# Patient Record
Sex: Female | Born: 1945 | Race: Black or African American | Hispanic: No | Marital: Married | Smoking: Former smoker
Health system: Southern US, Community
[De-identification: ages and names within clinical notes are randomized; demographics above are authoritative.]

## PROBLEM LIST (undated history)

## (undated) DIAGNOSIS — M439 Deforming dorsopathy, unspecified: Secondary | ICD-10-CM

## (undated) DIAGNOSIS — M1990 Unspecified osteoarthritis, unspecified site: Secondary | ICD-10-CM

## (undated) DIAGNOSIS — E039 Hypothyroidism, unspecified: Secondary | ICD-10-CM

## (undated) DIAGNOSIS — M4800 Spinal stenosis, site unspecified: Secondary | ICD-10-CM

## (undated) DIAGNOSIS — E785 Hyperlipidemia, unspecified: Secondary | ICD-10-CM

## (undated) DIAGNOSIS — I10 Essential (primary) hypertension: Secondary | ICD-10-CM

## (undated) DIAGNOSIS — M109 Gout, unspecified: Secondary | ICD-10-CM

## (undated) HISTORY — DX: Deforming dorsopathy, unspecified: M43.9

## (undated) HISTORY — DX: Gout, unspecified: M10.9

## (undated) HISTORY — DX: Spinal stenosis, site unspecified: M48.00

## (undated) HISTORY — PX: THYROID SURGERY: SHX805

## (undated) HISTORY — PX: HERNIA REPAIR: SHX51

---

## 2005-07-03 ENCOUNTER — Other Ambulatory Visit: Admission: RE | Admit: 2005-07-03 | Discharge: 2005-07-03 | Payer: Self-pay | Admitting: Family Medicine

## 2005-07-10 ENCOUNTER — Encounter: Admission: RE | Admit: 2005-07-10 | Discharge: 2005-07-10 | Payer: Self-pay | Admitting: Obstetrics and Gynecology

## 2005-08-21 ENCOUNTER — Encounter (INDEPENDENT_AMBULATORY_CARE_PROVIDER_SITE_OTHER): Payer: Self-pay | Admitting: *Deleted

## 2005-08-21 ENCOUNTER — Ambulatory Visit (HOSPITAL_COMMUNITY): Admission: RE | Admit: 2005-08-21 | Discharge: 2005-08-22 | Payer: Self-pay

## 2006-08-12 ENCOUNTER — Encounter: Admission: RE | Admit: 2006-08-12 | Discharge: 2006-08-12 | Payer: Self-pay | Admitting: Family Medicine

## 2007-10-09 ENCOUNTER — Encounter: Admission: RE | Admit: 2007-10-09 | Discharge: 2007-10-09 | Payer: Self-pay | Admitting: Obstetrics and Gynecology

## 2008-11-03 ENCOUNTER — Other Ambulatory Visit: Admission: RE | Admit: 2008-11-03 | Discharge: 2008-11-03 | Payer: Self-pay | Admitting: Family Medicine

## 2008-12-09 ENCOUNTER — Encounter: Admission: RE | Admit: 2008-12-09 | Discharge: 2008-12-09 | Payer: Self-pay | Admitting: Family Medicine

## 2010-08-02 ENCOUNTER — Other Ambulatory Visit (HOSPITAL_COMMUNITY): Payer: Self-pay | Admitting: Family Medicine

## 2010-08-02 DIAGNOSIS — Z1231 Encounter for screening mammogram for malignant neoplasm of breast: Secondary | ICD-10-CM

## 2010-08-11 ENCOUNTER — Ambulatory Visit (HOSPITAL_COMMUNITY)
Admission: RE | Admit: 2010-08-11 | Discharge: 2010-08-11 | Disposition: A | Discharge status: Home / Self Care | Payer: Self-pay | Source: Ambulatory Visit | Attending: Family Medicine | Admitting: Family Medicine

## 2010-08-11 DIAGNOSIS — Z1231 Encounter for screening mammogram for malignant neoplasm of breast: Secondary | ICD-10-CM

## 2010-09-15 NOTE — Op Note (Signed)
[name], [name] NO.:  [id number]   MEDICAL RECORD NO.:  [id number]          PATIENT TYPE:  OIB   LOCATION:  5703                         FACILITY:  [facility]   PHYSICIAN:  [name], [name]   DATE OF BIRTH:  January 01, 1946   DATE OF PROCEDURE:  08/30/2005  DATE OF DISCHARGE:  08/22/2005                                 OPERATIVE REPORT   PREOPERATIVE DIAGNOSIS:  Left thyroid nodule.   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   OPERATION:  Total thyroidectomy.   SURGEON:  [name], [name]   ASSISTANT:  [name], [name]   ANESTHESIA:  General.   PROCEDURE:  After the patient was monitored and asleep and had routine  preparation and draping of the neck, I made a collar type incision a couple  of cm above the clavicles, centered in the midline, and approximately 6 cm  in length.  I deepened the dissection through the platysma and developed  flaps superiorly to the thyroid cartilage and inferiorly to the sternal  notch.  I incised the straps in the midline and exposed thyroid gland.  The  right lobe felt normal.  The left lobe had a large nodule.  The gland,  itself, was somewhat shrunken.  ?I dissected laterally on the left dividing  veins toward the middle of the thyroid gland with clips.  I then dissected  out the superior thyroid vessels and ligated them with 2-0 silk and also  clipped and then divided them.  Rolling the gland forward, I pulled it up  and divided the vessels and inferiorly staying close to the gland, taking  care to avoid injury of the parathyroids and visualizing and avoiding the  recurrent laryngeal nerve.  I dissected over to the isthmus and found that  there was a little bit of a pyramidal lobe and I excised that along with the  left lobe.  I then suture ligated the isthmus and removed the left lobe.  I  sent it for frozen section evaluation and [name] reported that it was a  nodule which was highly  suspicious for papillary thyroid cancer.  Dr.  Zachery Dakins and I felt that we should then remove the other lobe, as well.  I  exposed the left lobe by dissecting under the straps and followed a similar  method of dissection and removal, again visualizing the recurrent laryngeal  nerve and staying close to the gland so as to avoid injury to the  parathyroids.  I did see a couple of parathyroid glands and felt that I had  not removed an excess of parathyroid tissue.  After removal of the right  lobe, I irrigated both sides, got hemostasis, and packed the dissected areas  with some Surgicel.  I closed the midline and the platysma with 3-0 Vicryl  and closed the skin with intracuticular 4-0 Vicryl and Steri-Strips.  The  patient was stable throughout.      [name], [name]  Electronically Signed     WB/MEDQ  D:  08/30/2005  T:  08/30/2005  Job:  [id number]

## 2011-07-17 ENCOUNTER — Other Ambulatory Visit (HOSPITAL_COMMUNITY): Payer: Self-pay | Admitting: Family Medicine

## 2011-07-17 DIAGNOSIS — Z1231 Encounter for screening mammogram for malignant neoplasm of breast: Secondary | ICD-10-CM

## 2011-08-17 ENCOUNTER — Ambulatory Visit (HOSPITAL_COMMUNITY)
Admission: RE | Admit: 2011-08-17 | Discharge: 2011-08-17 | Disposition: A | Discharge status: Home / Self Care | Payer: Self-pay | Source: Ambulatory Visit | Attending: Family Medicine | Admitting: Family Medicine

## 2011-08-17 DIAGNOSIS — Z1231 Encounter for screening mammogram for malignant neoplasm of breast: Secondary | ICD-10-CM

## 2012-07-21 ENCOUNTER — Other Ambulatory Visit: Payer: Self-pay

## 2012-07-21 DIAGNOSIS — Z1231 Encounter for screening mammogram for malignant neoplasm of breast: Secondary | ICD-10-CM

## 2012-08-25 ENCOUNTER — Ambulatory Visit
Admission: RE | Admit: 2012-08-25 | Discharge: 2012-08-25 | Disposition: A | Discharge status: Home / Self Care | Payer: Medicare Other | Source: Ambulatory Visit

## 2012-08-25 DIAGNOSIS — Z1231 Encounter for screening mammogram for malignant neoplasm of breast: Secondary | ICD-10-CM

## 2013-07-30 ENCOUNTER — Other Ambulatory Visit: Payer: Self-pay | Admitting: Family Medicine

## 2013-07-30 ENCOUNTER — Ambulatory Visit
Admission: RE | Admit: 2013-07-30 | Discharge: 2013-07-30 | Disposition: A | Discharge status: Home / Self Care | Payer: Commercial Managed Care - HMO | Source: Ambulatory Visit | Attending: Family Medicine | Admitting: Family Medicine

## 2013-07-30 DIAGNOSIS — M545 Low back pain, unspecified: Secondary | ICD-10-CM

## 2013-07-30 DIAGNOSIS — M79605 Pain in left leg: Secondary | ICD-10-CM

## 2013-08-26 ENCOUNTER — Other Ambulatory Visit: Payer: Self-pay

## 2013-08-26 DIAGNOSIS — Z1231 Encounter for screening mammogram for malignant neoplasm of breast: Secondary | ICD-10-CM

## 2013-09-03 ENCOUNTER — Ambulatory Visit
Admission: RE | Admit: 2013-09-03 | Discharge: 2013-09-03 | Disposition: A | Discharge status: Home / Self Care | Payer: Commercial Managed Care - HMO | Source: Ambulatory Visit

## 2013-09-03 ENCOUNTER — Encounter (INDEPENDENT_AMBULATORY_CARE_PROVIDER_SITE_OTHER): Payer: Self-pay

## 2013-09-03 DIAGNOSIS — Z1231 Encounter for screening mammogram for malignant neoplasm of breast: Secondary | ICD-10-CM

## 2014-03-10 ENCOUNTER — Other Ambulatory Visit: Payer: Self-pay | Admitting: Gastroenterology

## 2014-06-07 DIAGNOSIS — E039 Hypothyroidism, unspecified: Secondary | ICD-10-CM | POA: Diagnosis not present

## 2014-06-17 DIAGNOSIS — E039 Hypothyroidism, unspecified: Secondary | ICD-10-CM | POA: Diagnosis not present

## 2014-07-29 DIAGNOSIS — E039 Hypothyroidism, unspecified: Secondary | ICD-10-CM | POA: Diagnosis not present

## 2014-07-29 DIAGNOSIS — K5792 Diverticulitis of intestine, part unspecified, without perforation or abscess without bleeding: Secondary | ICD-10-CM | POA: Diagnosis not present

## 2014-07-29 DIAGNOSIS — Z23 Encounter for immunization: Secondary | ICD-10-CM | POA: Diagnosis not present

## 2014-08-24 DIAGNOSIS — H9190 Unspecified hearing loss, unspecified ear: Secondary | ICD-10-CM | POA: Diagnosis not present

## 2014-08-24 DIAGNOSIS — H6123 Impacted cerumen, bilateral: Secondary | ICD-10-CM | POA: Diagnosis not present

## 2014-08-30 ENCOUNTER — Other Ambulatory Visit: Payer: Self-pay

## 2014-08-30 DIAGNOSIS — Z1231 Encounter for screening mammogram for malignant neoplasm of breast: Secondary | ICD-10-CM

## 2014-09-06 ENCOUNTER — Ambulatory Visit
Admission: RE | Admit: 2014-09-06 | Discharge: 2014-09-06 | Disposition: A | Discharge status: Home / Self Care | Payer: Commercial Managed Care - HMO | Source: Ambulatory Visit

## 2014-09-06 DIAGNOSIS — Z1231 Encounter for screening mammogram for malignant neoplasm of breast: Secondary | ICD-10-CM | POA: Diagnosis not present

## 2014-12-22 ENCOUNTER — Other Ambulatory Visit (HOSPITAL_COMMUNITY)
Admission: RE | Admit: 2014-12-22 | Discharge: 2014-12-22 | Disposition: A | Discharge status: Home / Self Care | Payer: Commercial Managed Care - HMO | Source: Ambulatory Visit | Attending: Family Medicine | Admitting: Family Medicine

## 2014-12-22 ENCOUNTER — Other Ambulatory Visit: Payer: Self-pay | Admitting: Family Medicine

## 2014-12-22 DIAGNOSIS — R350 Frequency of micturition: Secondary | ICD-10-CM | POA: Diagnosis not present

## 2014-12-22 DIAGNOSIS — Z124 Encounter for screening for malignant neoplasm of cervix: Secondary | ICD-10-CM | POA: Diagnosis not present

## 2014-12-22 DIAGNOSIS — E780 Pure hypercholesterolemia: Secondary | ICD-10-CM | POA: Diagnosis not present

## 2014-12-22 DIAGNOSIS — I10 Essential (primary) hypertension: Secondary | ICD-10-CM | POA: Diagnosis not present

## 2014-12-22 DIAGNOSIS — E039 Hypothyroidism, unspecified: Secondary | ICD-10-CM | POA: Diagnosis not present

## 2014-12-22 DIAGNOSIS — J309 Allergic rhinitis, unspecified: Secondary | ICD-10-CM | POA: Diagnosis not present

## 2014-12-22 DIAGNOSIS — Z0000 Encounter for general adult medical examination without abnormal findings: Secondary | ICD-10-CM | POA: Diagnosis not present

## 2014-12-22 DIAGNOSIS — Z23 Encounter for immunization: Secondary | ICD-10-CM | POA: Diagnosis not present

## 2014-12-22 DIAGNOSIS — R21 Rash and other nonspecific skin eruption: Secondary | ICD-10-CM | POA: Diagnosis not present

## 2014-12-23 LAB — CYTOLOGY - PAP

## 2015-06-28 DIAGNOSIS — M1990 Unspecified osteoarthritis, unspecified site: Secondary | ICD-10-CM | POA: Diagnosis not present

## 2015-08-08 ENCOUNTER — Other Ambulatory Visit: Payer: Self-pay

## 2015-08-08 DIAGNOSIS — Z1231 Encounter for screening mammogram for malignant neoplasm of breast: Secondary | ICD-10-CM

## 2015-09-07 ENCOUNTER — Ambulatory Visit
Admission: RE | Admit: 2015-09-07 | Discharge: 2015-09-07 | Disposition: A | Discharge status: Home / Self Care | Payer: Commercial Managed Care - HMO | Source: Ambulatory Visit

## 2015-09-07 DIAGNOSIS — Z1231 Encounter for screening mammogram for malignant neoplasm of breast: Secondary | ICD-10-CM

## 2015-11-07 DIAGNOSIS — N898 Other specified noninflammatory disorders of vagina: Secondary | ICD-10-CM | POA: Diagnosis not present

## 2015-11-07 DIAGNOSIS — Z209 Contact with and (suspected) exposure to unspecified communicable disease: Secondary | ICD-10-CM | POA: Diagnosis not present

## 2016-01-04 DIAGNOSIS — H2513 Age-related nuclear cataract, bilateral: Secondary | ICD-10-CM | POA: Diagnosis not present

## 2016-01-04 DIAGNOSIS — H04123 Dry eye syndrome of bilateral lacrimal glands: Secondary | ICD-10-CM | POA: Diagnosis not present

## 2016-01-04 DIAGNOSIS — H353131 Nonexudative age-related macular degeneration, bilateral, early dry stage: Secondary | ICD-10-CM | POA: Diagnosis not present

## 2016-01-11 DIAGNOSIS — M1990 Unspecified osteoarthritis, unspecified site: Secondary | ICD-10-CM | POA: Diagnosis not present

## 2016-01-11 DIAGNOSIS — E039 Hypothyroidism, unspecified: Secondary | ICD-10-CM | POA: Diagnosis not present

## 2016-01-11 DIAGNOSIS — Z0000 Encounter for general adult medical examination without abnormal findings: Secondary | ICD-10-CM | POA: Diagnosis not present

## 2016-01-11 DIAGNOSIS — E7800 Pure hypercholesterolemia, unspecified: Secondary | ICD-10-CM | POA: Diagnosis not present

## 2016-01-11 DIAGNOSIS — I10 Essential (primary) hypertension: Secondary | ICD-10-CM | POA: Diagnosis not present

## 2016-01-11 DIAGNOSIS — M791 Myalgia: Secondary | ICD-10-CM | POA: Diagnosis not present

## 2016-01-11 DIAGNOSIS — Z23 Encounter for immunization: Secondary | ICD-10-CM | POA: Diagnosis not present

## 2016-01-11 DIAGNOSIS — H6120 Impacted cerumen, unspecified ear: Secondary | ICD-10-CM | POA: Diagnosis not present

## 2016-02-15 DIAGNOSIS — E7800 Pure hypercholesterolemia, unspecified: Secondary | ICD-10-CM | POA: Diagnosis not present

## 2016-02-15 DIAGNOSIS — R062 Wheezing: Secondary | ICD-10-CM | POA: Diagnosis not present

## 2016-02-15 DIAGNOSIS — I10 Essential (primary) hypertension: Secondary | ICD-10-CM | POA: Diagnosis not present

## 2016-02-15 DIAGNOSIS — H3530 Unspecified macular degeneration: Secondary | ICD-10-CM | POA: Diagnosis not present

## 2016-02-15 DIAGNOSIS — J309 Allergic rhinitis, unspecified: Secondary | ICD-10-CM | POA: Diagnosis not present

## 2016-02-15 DIAGNOSIS — M5416 Radiculopathy, lumbar region: Secondary | ICD-10-CM | POA: Diagnosis not present

## 2016-02-20 DIAGNOSIS — M4696 Unspecified inflammatory spondylopathy, lumbar region: Secondary | ICD-10-CM | POA: Diagnosis not present

## 2016-02-20 DIAGNOSIS — M5416 Radiculopathy, lumbar region: Secondary | ICD-10-CM | POA: Diagnosis not present

## 2016-03-08 ENCOUNTER — Other Ambulatory Visit: Payer: Self-pay | Admitting: Orthopedic Surgery

## 2016-03-08 DIAGNOSIS — M47816 Spondylosis without myelopathy or radiculopathy, lumbar region: Secondary | ICD-10-CM

## 2016-03-08 DIAGNOSIS — M5416 Radiculopathy, lumbar region: Secondary | ICD-10-CM

## 2016-03-12 DIAGNOSIS — M549 Dorsalgia, unspecified: Secondary | ICD-10-CM | POA: Diagnosis not present

## 2016-03-17 ENCOUNTER — Ambulatory Visit
Admission: RE | Admit: 2016-03-17 | Discharge: 2016-03-17 | Disposition: A | Discharge status: Home / Self Care | Payer: Commercial Managed Care - HMO | Source: Ambulatory Visit | Attending: Orthopedic Surgery | Admitting: Orthopedic Surgery

## 2016-03-17 DIAGNOSIS — M47816 Spondylosis without myelopathy or radiculopathy, lumbar region: Secondary | ICD-10-CM

## 2016-03-17 DIAGNOSIS — M48061 Spinal stenosis, lumbar region without neurogenic claudication: Secondary | ICD-10-CM | POA: Diagnosis not present

## 2016-03-17 DIAGNOSIS — M5416 Radiculopathy, lumbar region: Secondary | ICD-10-CM

## 2016-03-27 DIAGNOSIS — M48062 Spinal stenosis, lumbar region with neurogenic claudication: Secondary | ICD-10-CM | POA: Diagnosis not present

## 2016-03-29 DIAGNOSIS — M9913 Subluxation complex (vertebral) of lumbar region: Secondary | ICD-10-CM | POA: Diagnosis not present

## 2016-03-29 DIAGNOSIS — M4800 Spinal stenosis, site unspecified: Secondary | ICD-10-CM | POA: Diagnosis not present

## 2016-03-29 DIAGNOSIS — M545 Low back pain: Secondary | ICD-10-CM | POA: Diagnosis not present

## 2016-04-17 DIAGNOSIS — M9905 Segmental and somatic dysfunction of pelvic region: Secondary | ICD-10-CM | POA: Diagnosis not present

## 2016-04-17 DIAGNOSIS — M5136 Other intervertebral disc degeneration, lumbar region: Secondary | ICD-10-CM | POA: Diagnosis not present

## 2016-04-17 DIAGNOSIS — M9903 Segmental and somatic dysfunction of lumbar region: Secondary | ICD-10-CM | POA: Diagnosis not present

## 2016-04-17 DIAGNOSIS — M9904 Segmental and somatic dysfunction of sacral region: Secondary | ICD-10-CM | POA: Diagnosis not present

## 2016-04-18 DIAGNOSIS — M5136 Other intervertebral disc degeneration, lumbar region: Secondary | ICD-10-CM | POA: Diagnosis not present

## 2016-04-18 DIAGNOSIS — M9903 Segmental and somatic dysfunction of lumbar region: Secondary | ICD-10-CM | POA: Diagnosis not present

## 2016-04-18 DIAGNOSIS — M9905 Segmental and somatic dysfunction of pelvic region: Secondary | ICD-10-CM | POA: Diagnosis not present

## 2016-04-18 DIAGNOSIS — M9904 Segmental and somatic dysfunction of sacral region: Secondary | ICD-10-CM | POA: Diagnosis not present

## 2016-04-19 DIAGNOSIS — M9903 Segmental and somatic dysfunction of lumbar region: Secondary | ICD-10-CM | POA: Diagnosis not present

## 2016-04-19 DIAGNOSIS — M9904 Segmental and somatic dysfunction of sacral region: Secondary | ICD-10-CM | POA: Diagnosis not present

## 2016-04-19 DIAGNOSIS — M5136 Other intervertebral disc degeneration, lumbar region: Secondary | ICD-10-CM | POA: Diagnosis not present

## 2016-04-19 DIAGNOSIS — M9905 Segmental and somatic dysfunction of pelvic region: Secondary | ICD-10-CM | POA: Diagnosis not present

## 2016-04-25 DIAGNOSIS — M5136 Other intervertebral disc degeneration, lumbar region: Secondary | ICD-10-CM | POA: Diagnosis not present

## 2016-04-25 DIAGNOSIS — M9905 Segmental and somatic dysfunction of pelvic region: Secondary | ICD-10-CM | POA: Diagnosis not present

## 2016-04-25 DIAGNOSIS — M9904 Segmental and somatic dysfunction of sacral region: Secondary | ICD-10-CM | POA: Diagnosis not present

## 2016-04-25 DIAGNOSIS — M9903 Segmental and somatic dysfunction of lumbar region: Secondary | ICD-10-CM | POA: Diagnosis not present

## 2016-04-26 DIAGNOSIS — M9903 Segmental and somatic dysfunction of lumbar region: Secondary | ICD-10-CM | POA: Diagnosis not present

## 2016-04-26 DIAGNOSIS — M5136 Other intervertebral disc degeneration, lumbar region: Secondary | ICD-10-CM | POA: Diagnosis not present

## 2016-04-26 DIAGNOSIS — M9905 Segmental and somatic dysfunction of pelvic region: Secondary | ICD-10-CM | POA: Diagnosis not present

## 2016-04-26 DIAGNOSIS — M9904 Segmental and somatic dysfunction of sacral region: Secondary | ICD-10-CM | POA: Diagnosis not present

## 2016-04-27 DIAGNOSIS — M9905 Segmental and somatic dysfunction of pelvic region: Secondary | ICD-10-CM | POA: Diagnosis not present

## 2016-04-27 DIAGNOSIS — M9904 Segmental and somatic dysfunction of sacral region: Secondary | ICD-10-CM | POA: Diagnosis not present

## 2016-04-27 DIAGNOSIS — M5136 Other intervertebral disc degeneration, lumbar region: Secondary | ICD-10-CM | POA: Diagnosis not present

## 2016-04-27 DIAGNOSIS — M9903 Segmental and somatic dysfunction of lumbar region: Secondary | ICD-10-CM | POA: Diagnosis not present

## 2016-05-01 DIAGNOSIS — M5136 Other intervertebral disc degeneration, lumbar region: Secondary | ICD-10-CM | POA: Diagnosis not present

## 2016-05-01 DIAGNOSIS — M9904 Segmental and somatic dysfunction of sacral region: Secondary | ICD-10-CM | POA: Diagnosis not present

## 2016-05-01 DIAGNOSIS — M9903 Segmental and somatic dysfunction of lumbar region: Secondary | ICD-10-CM | POA: Diagnosis not present

## 2016-05-01 DIAGNOSIS — M9905 Segmental and somatic dysfunction of pelvic region: Secondary | ICD-10-CM | POA: Diagnosis not present

## 2016-05-02 DIAGNOSIS — M9904 Segmental and somatic dysfunction of sacral region: Secondary | ICD-10-CM | POA: Diagnosis not present

## 2016-05-02 DIAGNOSIS — M5136 Other intervertebral disc degeneration, lumbar region: Secondary | ICD-10-CM | POA: Diagnosis not present

## 2016-05-02 DIAGNOSIS — M9905 Segmental and somatic dysfunction of pelvic region: Secondary | ICD-10-CM | POA: Diagnosis not present

## 2016-05-02 DIAGNOSIS — M9903 Segmental and somatic dysfunction of lumbar region: Secondary | ICD-10-CM | POA: Diagnosis not present

## 2016-05-04 DIAGNOSIS — M5136 Other intervertebral disc degeneration, lumbar region: Secondary | ICD-10-CM | POA: Diagnosis not present

## 2016-05-04 DIAGNOSIS — M9905 Segmental and somatic dysfunction of pelvic region: Secondary | ICD-10-CM | POA: Diagnosis not present

## 2016-05-04 DIAGNOSIS — M9903 Segmental and somatic dysfunction of lumbar region: Secondary | ICD-10-CM | POA: Diagnosis not present

## 2016-05-04 DIAGNOSIS — M9904 Segmental and somatic dysfunction of sacral region: Secondary | ICD-10-CM | POA: Diagnosis not present

## 2016-05-07 DIAGNOSIS — M9903 Segmental and somatic dysfunction of lumbar region: Secondary | ICD-10-CM | POA: Diagnosis not present

## 2016-05-07 DIAGNOSIS — M9905 Segmental and somatic dysfunction of pelvic region: Secondary | ICD-10-CM | POA: Diagnosis not present

## 2016-05-07 DIAGNOSIS — M5136 Other intervertebral disc degeneration, lumbar region: Secondary | ICD-10-CM | POA: Diagnosis not present

## 2016-05-07 DIAGNOSIS — M9904 Segmental and somatic dysfunction of sacral region: Secondary | ICD-10-CM | POA: Diagnosis not present

## 2016-05-11 DIAGNOSIS — M9905 Segmental and somatic dysfunction of pelvic region: Secondary | ICD-10-CM | POA: Diagnosis not present

## 2016-05-11 DIAGNOSIS — M5136 Other intervertebral disc degeneration, lumbar region: Secondary | ICD-10-CM | POA: Diagnosis not present

## 2016-05-11 DIAGNOSIS — M9904 Segmental and somatic dysfunction of sacral region: Secondary | ICD-10-CM | POA: Diagnosis not present

## 2016-05-11 DIAGNOSIS — M9903 Segmental and somatic dysfunction of lumbar region: Secondary | ICD-10-CM | POA: Diagnosis not present

## 2016-05-21 DIAGNOSIS — M5136 Other intervertebral disc degeneration, lumbar region: Secondary | ICD-10-CM | POA: Diagnosis not present

## 2016-05-21 DIAGNOSIS — M9904 Segmental and somatic dysfunction of sacral region: Secondary | ICD-10-CM | POA: Diagnosis not present

## 2016-05-21 DIAGNOSIS — M9903 Segmental and somatic dysfunction of lumbar region: Secondary | ICD-10-CM | POA: Diagnosis not present

## 2016-05-21 DIAGNOSIS — M9905 Segmental and somatic dysfunction of pelvic region: Secondary | ICD-10-CM | POA: Diagnosis not present

## 2016-05-31 DIAGNOSIS — M5136 Other intervertebral disc degeneration, lumbar region: Secondary | ICD-10-CM | POA: Diagnosis not present

## 2016-05-31 DIAGNOSIS — M9904 Segmental and somatic dysfunction of sacral region: Secondary | ICD-10-CM | POA: Diagnosis not present

## 2016-05-31 DIAGNOSIS — M9905 Segmental and somatic dysfunction of pelvic region: Secondary | ICD-10-CM | POA: Diagnosis not present

## 2016-05-31 DIAGNOSIS — M9903 Segmental and somatic dysfunction of lumbar region: Secondary | ICD-10-CM | POA: Diagnosis not present

## 2016-06-14 DIAGNOSIS — M9903 Segmental and somatic dysfunction of lumbar region: Secondary | ICD-10-CM | POA: Diagnosis not present

## 2016-06-14 DIAGNOSIS — M9905 Segmental and somatic dysfunction of pelvic region: Secondary | ICD-10-CM | POA: Diagnosis not present

## 2016-06-14 DIAGNOSIS — M9904 Segmental and somatic dysfunction of sacral region: Secondary | ICD-10-CM | POA: Diagnosis not present

## 2016-06-14 DIAGNOSIS — M5136 Other intervertebral disc degeneration, lumbar region: Secondary | ICD-10-CM | POA: Diagnosis not present

## 2016-06-29 DIAGNOSIS — M5136 Other intervertebral disc degeneration, lumbar region: Secondary | ICD-10-CM | POA: Diagnosis not present

## 2016-06-29 DIAGNOSIS — M9903 Segmental and somatic dysfunction of lumbar region: Secondary | ICD-10-CM | POA: Diagnosis not present

## 2016-06-29 DIAGNOSIS — M9904 Segmental and somatic dysfunction of sacral region: Secondary | ICD-10-CM | POA: Diagnosis not present

## 2016-06-29 DIAGNOSIS — M9905 Segmental and somatic dysfunction of pelvic region: Secondary | ICD-10-CM | POA: Diagnosis not present

## 2016-07-26 DIAGNOSIS — M5136 Other intervertebral disc degeneration, lumbar region: Secondary | ICD-10-CM | POA: Diagnosis not present

## 2016-07-26 DIAGNOSIS — M9905 Segmental and somatic dysfunction of pelvic region: Secondary | ICD-10-CM | POA: Diagnosis not present

## 2016-07-26 DIAGNOSIS — M9904 Segmental and somatic dysfunction of sacral region: Secondary | ICD-10-CM | POA: Diagnosis not present

## 2016-07-26 DIAGNOSIS — M9903 Segmental and somatic dysfunction of lumbar region: Secondary | ICD-10-CM | POA: Diagnosis not present

## 2016-07-30 DIAGNOSIS — M9905 Segmental and somatic dysfunction of pelvic region: Secondary | ICD-10-CM | POA: Diagnosis not present

## 2016-07-30 DIAGNOSIS — M9904 Segmental and somatic dysfunction of sacral region: Secondary | ICD-10-CM | POA: Diagnosis not present

## 2016-07-30 DIAGNOSIS — M9903 Segmental and somatic dysfunction of lumbar region: Secondary | ICD-10-CM | POA: Diagnosis not present

## 2016-07-30 DIAGNOSIS — M5136 Other intervertebral disc degeneration, lumbar region: Secondary | ICD-10-CM | POA: Diagnosis not present

## 2016-08-07 DIAGNOSIS — Z86010 Personal history of colonic polyps: Secondary | ICD-10-CM | POA: Diagnosis not present

## 2016-08-07 DIAGNOSIS — K5730 Diverticulosis of large intestine without perforation or abscess without bleeding: Secondary | ICD-10-CM | POA: Diagnosis not present

## 2016-08-07 DIAGNOSIS — K640 First degree hemorrhoids: Secondary | ICD-10-CM | POA: Diagnosis not present

## 2016-09-05 ENCOUNTER — Other Ambulatory Visit: Payer: Self-pay | Admitting: Family Medicine

## 2016-09-05 DIAGNOSIS — Z1231 Encounter for screening mammogram for malignant neoplasm of breast: Secondary | ICD-10-CM

## 2016-10-02 ENCOUNTER — Ambulatory Visit
Admission: RE | Admit: 2016-10-02 | Discharge: 2016-10-02 | Disposition: A | Discharge status: Home / Self Care | Payer: Commercial Managed Care - HMO | Source: Ambulatory Visit | Attending: Family Medicine | Admitting: Family Medicine

## 2016-10-02 DIAGNOSIS — Z1231 Encounter for screening mammogram for malignant neoplasm of breast: Secondary | ICD-10-CM

## 2016-10-11 DIAGNOSIS — H43392 Other vitreous opacities, left eye: Secondary | ICD-10-CM | POA: Diagnosis not present

## 2017-09-02 ENCOUNTER — Other Ambulatory Visit: Payer: Self-pay | Admitting: Family Medicine

## 2017-09-02 DIAGNOSIS — Z1231 Encounter for screening mammogram for malignant neoplasm of breast: Secondary | ICD-10-CM

## 2017-10-03 ENCOUNTER — Ambulatory Visit
Admission: RE | Admit: 2017-10-03 | Discharge: 2017-10-03 | Disposition: A | Discharge status: Home / Self Care | Payer: Medicare Other | Source: Ambulatory Visit | Attending: Family Medicine | Admitting: Family Medicine

## 2017-10-03 DIAGNOSIS — Z1231 Encounter for screening mammogram for malignant neoplasm of breast: Secondary | ICD-10-CM

## 2017-10-07 ENCOUNTER — Other Ambulatory Visit: Payer: Self-pay | Admitting: Orthopedic Surgery

## 2017-11-01 ENCOUNTER — Encounter (HOSPITAL_BASED_OUTPATIENT_CLINIC_OR_DEPARTMENT_OTHER): Payer: Self-pay | Admitting: *Deleted

## 2017-11-07 ENCOUNTER — Ambulatory Visit (HOSPITAL_BASED_OUTPATIENT_CLINIC_OR_DEPARTMENT_OTHER)
Admission: RE | Admit: 2017-11-07 | Discharge: 2017-11-07 | Disposition: A | Discharge status: Home / Self Care | Payer: Medicare Other | Source: Ambulatory Visit | Attending: Orthopedic Surgery | Admitting: Orthopedic Surgery

## 2017-11-07 ENCOUNTER — Other Ambulatory Visit: Payer: Self-pay

## 2017-11-07 ENCOUNTER — Ambulatory Visit (HOSPITAL_BASED_OUTPATIENT_CLINIC_OR_DEPARTMENT_OTHER): Payer: Medicare Other | Admitting: Certified Registered"

## 2017-11-07 ENCOUNTER — Encounter (HOSPITAL_BASED_OUTPATIENT_CLINIC_OR_DEPARTMENT_OTHER): Payer: Self-pay | Admitting: *Deleted

## 2017-11-07 ENCOUNTER — Encounter (HOSPITAL_BASED_OUTPATIENT_CLINIC_OR_DEPARTMENT_OTHER)
Admission: RE | Disposition: A | Discharge status: Home / Self Care | Payer: Self-pay | Source: Ambulatory Visit | Attending: Orthopedic Surgery

## 2017-11-07 DIAGNOSIS — Z882 Allergy status to sulfonamides status: Secondary | ICD-10-CM | POA: Diagnosis not present

## 2017-11-07 DIAGNOSIS — Z881 Allergy status to other antibiotic agents status: Secondary | ICD-10-CM | POA: Diagnosis not present

## 2017-11-07 DIAGNOSIS — M65332 Trigger finger, left middle finger: Secondary | ICD-10-CM | POA: Insufficient documentation

## 2017-11-07 DIAGNOSIS — E785 Hyperlipidemia, unspecified: Secondary | ICD-10-CM | POA: Diagnosis not present

## 2017-11-07 DIAGNOSIS — Z886 Allergy status to analgesic agent status: Secondary | ICD-10-CM | POA: Insufficient documentation

## 2017-11-07 DIAGNOSIS — Z803 Family history of malignant neoplasm of breast: Secondary | ICD-10-CM | POA: Insufficient documentation

## 2017-11-07 DIAGNOSIS — Z87891 Personal history of nicotine dependence: Secondary | ICD-10-CM | POA: Insufficient documentation

## 2017-11-07 DIAGNOSIS — M1990 Unspecified osteoarthritis, unspecified site: Secondary | ICD-10-CM | POA: Insufficient documentation

## 2017-11-07 DIAGNOSIS — E039 Hypothyroidism, unspecified: Secondary | ICD-10-CM | POA: Insufficient documentation

## 2017-11-07 DIAGNOSIS — I10 Essential (primary) hypertension: Secondary | ICD-10-CM | POA: Diagnosis not present

## 2017-11-07 HISTORY — DX: Hypothyroidism, unspecified: E03.9

## 2017-11-07 HISTORY — DX: Hyperlipidemia, unspecified: E78.5

## 2017-11-07 HISTORY — DX: Unspecified osteoarthritis, unspecified site: M19.90

## 2017-11-07 HISTORY — PX: TRIGGER FINGER RELEASE: SHX641

## 2017-11-07 HISTORY — DX: Essential (primary) hypertension: I10

## 2017-11-07 SURGERY — RELEASE, A1 PULLEY, FOR TRIGGER FINGER
Anesthesia: Monitor Anesthesia Care | Site: Hand | Laterality: Left

## 2017-11-07 MED ORDER — CHLORHEXIDINE GLUCONATE 4 % EX LIQD: 60.0000 mL | Freq: Once | CUTANEOUS | Status: DC

## 2017-11-07 MED ORDER — HYDROCODONE-ACETAMINOPHEN 5-325 MG PO TABS: ORAL_TABLET | ORAL | 0 refills | Status: AC

## 2017-11-07 MED ORDER — LACTATED RINGERS IV SOLN
INTRAVENOUS | Status: DC
  Administered 2017-11-07 (×3): via INTRAVENOUS

## 2017-11-07 MED ORDER — ONDANSETRON HCL 4 MG/2ML IJ SOLN
INTRAMUSCULAR | Status: DC | PRN
  Administered 2017-11-07: 4 mg via INTRAVENOUS

## 2017-11-07 MED ORDER — PROPOFOL 10 MG/ML IV BOLUS
INTRAVENOUS | Status: DC | PRN
  Administered 2017-11-07 (×2): 20 mg via INTRAVENOUS

## 2017-11-07 MED ORDER — FENTANYL CITRATE (PF) 100 MCG/2ML IJ SOLN
INTRAMUSCULAR | Status: AC
  Filled 2017-11-07: qty 2

## 2017-11-07 MED ORDER — CEFAZOLIN SODIUM-DEXTROSE 2-4 GM/100ML-% IV SOLN
2.0000 g | INTRAVENOUS | Status: AC
  Administered 2017-11-07: 2 g via INTRAVENOUS

## 2017-11-07 MED ORDER — LIDOCAINE HCL (PF) 0.5 % IJ SOLN
INTRAMUSCULAR | Status: DC | PRN
  Administered 2017-11-07: 30 mg via INTRAVENOUS

## 2017-11-07 MED ORDER — CEFAZOLIN SODIUM-DEXTROSE 2-4 GM/100ML-% IV SOLN
INTRAVENOUS | Status: AC
  Filled 2017-11-07: qty 100

## 2017-11-07 MED ORDER — FENTANYL CITRATE (PF) 100 MCG/2ML IJ SOLN
50.0000 ug | INTRAMUSCULAR | Status: DC | PRN
  Administered 2017-11-07 (×2): 50 ug via INTRAVENOUS

## 2017-11-07 MED ORDER — MIDAZOLAM HCL 2 MG/2ML IJ SOLN
INTRAMUSCULAR | Status: AC
  Filled 2017-11-07: qty 2

## 2017-11-07 MED ORDER — SCOPOLAMINE 1 MG/3DAYS TD PT72: 1.0000 | MEDICATED_PATCH | Freq: Once | TRANSDERMAL | Status: DC | PRN

## 2017-11-07 MED ORDER — MIDAZOLAM HCL 2 MG/2ML IJ SOLN
1.0000 mg | INTRAMUSCULAR | Status: DC | PRN
  Administered 2017-11-07: 2 mg via INTRAVENOUS

## 2017-11-07 SURGICAL SUPPLY — 30 items
BANDAGE COBAN STERILE 2 (GAUZE/BANDAGES/DRESSINGS) ×2 IMPLANT
BLADE SURG 15 STRL LF DISP TIS (BLADE) ×2 IMPLANT
BLADE SURG 15 STRL SS (BLADE) ×4
BNDG ESMARK 4X9 LF (GAUZE/BANDAGES/DRESSINGS) IMPLANT
CHLORAPREP W/TINT 26ML (MISCELLANEOUS) ×2 IMPLANT
CORD BIPOLAR FORCEPS 12FT (ELECTRODE) ×2 IMPLANT
COVER BACK TABLE 60X90IN (DRAPES) ×2 IMPLANT
COVER MAYO STAND STRL (DRAPES) ×2 IMPLANT
CUFF TOURNIQUET SINGLE 18IN (TOURNIQUET CUFF) ×2 IMPLANT
DRAPE EXTREMITY T 121X128X90 (DRAPE) ×2 IMPLANT
DRAPE SURG 17X23 STRL (DRAPES) ×2 IMPLANT
GAUZE SPONGE 4X4 12PLY STRL (GAUZE/BANDAGES/DRESSINGS) ×2 IMPLANT
GAUZE XEROFORM 1X8 LF (GAUZE/BANDAGES/DRESSINGS) ×2 IMPLANT
GLOVE BIO SURGEON STRL SZ7.5 (GLOVE) ×2 IMPLANT
GLOVE BIOGEL M STRL SZ7.5 (GLOVE) ×2 IMPLANT
GLOVE BIOGEL PI IND STRL 8 (GLOVE) ×2 IMPLANT
GLOVE BIOGEL PI INDICATOR 8 (GLOVE) ×2
GOWN STRL REUS W/ TWL LRG LVL3 (GOWN DISPOSABLE) IMPLANT
GOWN STRL REUS W/ TWL XL LVL3 (GOWN DISPOSABLE) ×1 IMPLANT
GOWN STRL REUS W/TWL LRG LVL3 (GOWN DISPOSABLE)
GOWN STRL REUS W/TWL XL LVL3 (GOWN DISPOSABLE) ×3 IMPLANT
NEEDLE HYPO 25X1 1.5 SAFETY (NEEDLE) ×2 IMPLANT
NS IRRIG 1000ML POUR BTL (IV SOLUTION) ×2 IMPLANT
PACK BASIN DAY SURGERY FS (CUSTOM PROCEDURE TRAY) ×2 IMPLANT
STOCKINETTE 4X48 STRL (DRAPES) ×2 IMPLANT
SUT ETHILON 4 0 PS 2 18 (SUTURE) ×2 IMPLANT
SYR BULB 3OZ (MISCELLANEOUS) ×2 IMPLANT
SYR CONTROL 10ML LL (SYRINGE) ×2 IMPLANT
TOWEL GREEN STERILE FF (TOWEL DISPOSABLE) ×4 IMPLANT
UNDERPAD 30X30 (UNDERPADS AND DIAPERS) ×2 IMPLANT

## 2017-11-07 NOTE — Discharge Instructions (Addendum)

## 2017-11-07 NOTE — Anesthesia Preprocedure Evaluation (Signed)
Anesthesia Evaluation  Patient identified by MRN, date of birth, ID band Patient awake    Airway Mallampati: I       Dental no notable dental hx. (+) Teeth Intact   Pulmonary former smoker,    Pulmonary exam normal breath sounds clear to auscultation       Cardiovascular hypertension, Pt. on medications Normal cardiovascular exam Rhythm:Regular Rate:Normal     Neuro/Psych negative neurological ROS  negative psych ROS   GI/Hepatic negative GI ROS, Neg liver ROS,   Endo/Other  Hypothyroidism   Renal/GU negative Renal ROS  negative genitourinary   Musculoskeletal   Abdominal Normal abdominal exam  (+)   Peds  Hematology negative hematology ROS (+)   Anesthesia Other Findings   Reproductive/Obstetrics                             Anesthesia Physical Anesthesia Plan  ASA: II  Anesthesia Plan: Bier Block and MAC and Bier Block-LIDOCAINE ONLY   Post-op Pain Management:    Induction:   PONV Risk Score and Plan: Ondansetron and Dexamethasone  Airway Management Planned: Natural Airway, Nasal Cannula and Simple Face Mask  Additional Equipment:   Intra-op Plan:   Post-operative Plan:   Informed Consent: I have reviewed the patients History and Physical, chart, labs and discussed the procedure including the risks, benefits and alternatives for the proposed anesthesia with the patient or authorized representative who has indicated his/her understanding and acceptance.     Plan Discussed with: CRNA and Surgeon  Anesthesia Plan Comments:         Anesthesia Quick Evaluation

## 2017-11-07 NOTE — H&P (Signed)
  Natasha [name] is an 72 y.o. female.   Chief Complaint: left long finger trigger digit HPI: 72 yo female with triggering left long finger.  This is bothersome to her.  She wishes to have surgical release of the trigger digit.  Allergies:  Allergies  Allergen Reactions  . Asa [Aspirin] Nausea And Vomiting  . Tramadol Other (See Comments)    delusional  . Cefdinir Diarrhea  . Ciprofloxacin Rash  . Sulfa Antibiotics Rash    Past Medical History:  Diagnosis Date  . Arthritis   . Hyperlipidemia   . Hypertension   . Hypothyroidism     Past Surgical History:  Procedure Laterality Date  . HERNIA REPAIR    . THYROID SURGERY      Family History: Family History  Problem Relation Age of Onset  . Breast cancer Mother 27    Social History:   reports that she has quit smoking. She has never used smokeless tobacco. She reports that she does not drink alcohol or use drugs.  Medications: No medications prior to admission.    No results found for this or any previous visit (from the past 48 hour(s)).  No results found.   A comprehensive review of systems was negative.  Height 5\' 4"  (1.626 m), weight 76.2 kg (168 lb).  General appearance: alert, cooperative and appears stated age Head: Normocephalic, without obvious abnormality, atraumatic Neck: supple, symmetrical, trachea midline Cardio: regular rate and rhythm Resp: clear to auscultation bilaterally Extremities: Intact sensation and capillary refill all digits.  +epl/fpl/io.  No wounds.  Pulses: 2+ and symmetric Skin: Skin color, texture, turgor normal. No rashes or lesions Neurologic: Grossly normal Incision/Wound: none  Assessment/Plan Left long finger trigger digit.  Non operative and operative treatment options were discussed with the patient and patient wishes to proceed with operative treatment. Risks, benefits, and alternatives of surgery were discussed and the patient agrees with the plan of  care.   Harris Penton R 11/07/2017, 8:28 AM

## 2017-11-07 NOTE — Transfer of Care (Signed)
Immediate Anesthesia Transfer of Care Note  Patient: Natasha [name]  Procedure(s) Performed: RELEASE TRIGGER FINGER/A-1 PULLEY EFT MIDDLE FINGER (Left Hand)  Patient Location: PACU  Anesthesia Type:MAC  Level of Consciousness: awake, alert  and oriented  Airway & Oxygen Therapy: Patient Spontanous Breathing and Patient connected to face mask oxygen  Post-op Assessment: Report given to RN and Post -op Vital signs reviewed and stable  Post vital signs: Reviewed and stable  Last Vitals:  Vitals Value Taken Time  BP 105/62 11/07/2017 12:30 PM  Temp    Pulse 51 11/07/2017 12:31 PM  Resp 15 11/07/2017 12:31 PM  SpO2 98 % 11/07/2017 12:31 PM  Vitals shown include unvalidated device data.  Last Pain:  Vitals:   11/07/17 0932  TempSrc: Oral  PainSc: 0-No pain         Complications: No apparent anesthesia complications

## 2017-11-07 NOTE — Op Note (Signed)
11/07/2017 [facility]  Operative Note  PREOPERATIVE DIAGNOSIS: LEFT MIDDLE FINGER TRIGGER  POSTOPERATIVE DIAGNOSIS:  LEFT MIDDLE FINGER TRIGGER  PROCEDURE: Procedure(s): RELEASE TRIGGER FINGER/A-1 PULLEY EFT MIDDLE FINGER  SURGEON:  Leanora Cover, MD  ASSISTANT:  none.  ANESTHESIA:  Bier block with sedation.  IV FLUIDS:  Per anesthesia flow sheet.  ESTIMATED BLOOD LOSS:  Minimal.  COMPLICATIONS:  None.  SPECIMENS:  None.  TOURNIQUET TIME:  Total Tourniquet Time Documented: Forearm (Left) - 20 minutes Total: Forearm (Left) - 20 minutes   DISPOSITION:  Stable to PACU.  LOCATION: [facility]  INDICATIONS: Natasha [name] is a 72 y.o. female with triggering left long finger.  She wishes to have surgical release.  Risks, benefits and alternatives of surgery were discussed including the risk of blood loss, infection, damage to nerves, vessels, tendons, ligaments, bone, failure of surgery, need for additional surgery, complications with wound healing, continued pain, continued triggering and need for repeat surgery.  She voiced understanding of these risks and elected to proceed.  OPERATIVE COURSE:  After being identified preoperatively by myself, the patient and I agreed upon the procedure and site of procedure.  The surgical site was marked. The risks, benefits, and alternatives of surgery were reviewed and she wished to proceed.  Surgical consent had been signed. She was given IV Ancef as preoperative antibiotic prophylaxis. She was transported to the operating room and placed on the operating room table in supine position with the Left upper extremity on an arm board. Bier block anesthesia was induced by the anesthesiologist.  The Left upper extremity was prepped and draped in normal sterile orthopedic fashion. A surgical pause was performed between surgeons, anesthesia, and operating room staff, and all were in agreement as to the patient, procedure,  and site of procedure.  Tourniquet at the proximal aspect of the forearm had been inflated for the Bier block.  An incision was made at the volar aspect of the MP joint of the long finger.  This was carried into the subcutaneous tissues by preading technique.  Bipolar electrocautery was used to obtain hemostasis.  The radial and ulnar digital nerves were protected throughout the case. The flexor sheath was identified.  The A1 pulley was identified and sharply incised.  It was released in its entirety.  The proximal 1-2 mm of the A2 pulley was vented to allow better excursion of the tendons.  The finger was placed through a range of motion and there was noted to be no catching.  The tendons were brought through the wound and any adherences released.  The wound was then copiously irrigated with sterile saline. It was closed with 4-0 nylon in a horizontal mattress fashion.  It was injected with 0.25% plain Marcaine to aid in postoperative analgesia.  It was dressed with sterile Xeroform, 4x4s, and wrapped lightly with a Coban dressing.  Tourniquet was deflated at 20 minutes.  The fingertips were pink with brisk capillary refill after deflation of the tourniquet.  The operative drapes were broken down and the patient was awoken from anesthesia safely.  She was transferred back to the stretcher and taken to the PACU in stable condition.   I will see her back in the office in 1 week for postoperative followup.  I will give her a prescription for Norco 5/325 1-2 tabs PO q6 hours prn pain, dispense # 20.    Tennis Must, MD Electronically signed, 11/07/17

## 2017-11-08 ENCOUNTER — Encounter (HOSPITAL_BASED_OUTPATIENT_CLINIC_OR_DEPARTMENT_OTHER): Payer: Self-pay | Admitting: Orthopedic Surgery

## 2017-11-08 LAB — POCT I-STAT, CHEM 8
BUN: 9 mg/dL (ref 8–23)
CHLORIDE: 103 mmol/L (ref 98–111)
Calcium, Ion: 1.08 mmol/L — ABNORMAL LOW (ref 1.15–1.40)
Creatinine, Ser: 0.8 mg/dL (ref 0.44–1.00)
Glucose, Bld: 99 mg/dL (ref 70–99)
HEMATOCRIT: 43 % (ref 36.0–46.0)
HEMOGLOBIN: 14.6 g/dL (ref 12.0–15.0)
POTASSIUM: 3.1 mmol/L — AB (ref 3.5–5.1)
SODIUM: 141 mmol/L (ref 135–145)
TCO2: 24 mmol/L (ref 22–32)

## 2017-11-10 ENCOUNTER — Encounter (HOSPITAL_BASED_OUTPATIENT_CLINIC_OR_DEPARTMENT_OTHER): Payer: Self-pay | Admitting: Orthopedic Surgery

## 2017-11-10 NOTE — Anesthesia Postprocedure Evaluation (Signed)
Anesthesia Post Note  Patient: Natasha [name]  Procedure(s) Performed: RELEASE TRIGGER FINGER/A-1 PULLEY EFT MIDDLE FINGER (Left Hand)     Patient location during evaluation: PACU Anesthesia Type: MAC Level of consciousness: awake Pain management: pain level controlled Vital Signs Assessment: post-procedure vital signs reviewed and stable Respiratory status: spontaneous breathing Cardiovascular status: stable Postop Assessment: no apparent nausea or vomiting Anesthetic complications: no    Last Vitals:  Vitals:   11/07/17 1300 11/07/17 1337  BP: 119/66   Pulse: (!) 50 (!) 44  Resp: 20 (!) 22  Temp:  36.5 C  SpO2: 96% 100%    Last Pain:  Vitals:   11/08/17 0931  TempSrc:   PainSc: 2    Pain Goal:                 [name],[name]

## 2018-08-21 ENCOUNTER — Other Ambulatory Visit: Payer: Self-pay | Admitting: Family Medicine

## 2018-08-21 DIAGNOSIS — E2839 Other primary ovarian failure: Secondary | ICD-10-CM

## 2018-08-27 ENCOUNTER — Other Ambulatory Visit: Payer: Self-pay | Admitting: Family Medicine

## 2018-08-27 DIAGNOSIS — Z1231 Encounter for screening mammogram for malignant neoplasm of breast: Secondary | ICD-10-CM

## 2018-09-30 ENCOUNTER — Other Ambulatory Visit: Payer: Self-pay

## 2018-09-30 DIAGNOSIS — M79605 Pain in left leg: Secondary | ICD-10-CM

## 2018-10-06 ENCOUNTER — Telehealth (HOSPITAL_COMMUNITY): Payer: Self-pay | Admitting: Rehabilitation

## 2018-10-06 NOTE — Telephone Encounter (Signed)

## 2018-10-07 ENCOUNTER — Other Ambulatory Visit: Payer: Self-pay

## 2018-10-07 ENCOUNTER — Ambulatory Visit (INDEPENDENT_AMBULATORY_CARE_PROVIDER_SITE_OTHER): Payer: Medicare Other | Admitting: Vascular Surgery

## 2018-10-07 ENCOUNTER — Ambulatory Visit (HOSPITAL_COMMUNITY)
Admission: RE | Admit: 2018-10-07 | Discharge: 2018-10-07 | Disposition: A | Discharge status: Home / Self Care | Payer: Medicare Other | Source: Ambulatory Visit | Attending: Family | Admitting: Family

## 2018-10-07 ENCOUNTER — Encounter: Payer: Self-pay | Admitting: Vascular Surgery

## 2018-10-07 DIAGNOSIS — M25569 Pain in unspecified knee: Secondary | ICD-10-CM | POA: Insufficient documentation

## 2018-10-07 DIAGNOSIS — M79605 Pain in left leg: Secondary | ICD-10-CM | POA: Insufficient documentation

## 2018-10-07 DIAGNOSIS — M25562 Pain in left knee: Secondary | ICD-10-CM

## 2018-10-07 NOTE — Progress Notes (Signed)
Patient name: MAZIYAH VESSEL MRN: [id number] DOB: [birth year] Sex: female  REASON FOR CONSULT: Concern for PAD  HPI: NORETA KUE is a 73 y.o. female, with history of hypertension hyperlipidemia that presents for evaluation of PAD.  Patient states she has been very nervous given that she had a positive PAD screen last year with a home health nurse.  She states in addition she has been having some pain in her left calf that has been ongoing for years.  Particularly this bothers her at nighttime when she is sleeping.  She describes it as a very crampy and achy feeling.  She states she does not have any significant leg pain with walking.  She is had no ulcerations or tissue loss on her feet.  No previous lower extremity revascularization states she never been evaluated by vascular surgeon.  Denies current tobacco use.  Past Medical History:  Diagnosis Date  . Arthritis   . Hyperlipidemia   . Hypertension   . Hypothyroidism     Past Surgical History:  Procedure Laterality Date  . HERNIA REPAIR    . THYROID SURGERY    . TRIGGER FINGER RELEASE Left 11/07/2017   Procedure: RELEASE TRIGGER FINGER/A-1 PULLEY EFT MIDDLE FINGER;  Surgeon: Leanora Cover, MD;  Location: [place];  Service: Orthopedics;  Laterality: Left;    Family History  Problem Relation Age of Onset  . Breast cancer Mother 8    SOCIAL HISTORY: Social History   Socioeconomic History  . Marital status: Legally Separated    Spouse name: Not on file  . Number of children: Not on file  . Years of education: Not on file  . Highest education level: Not on file  Occupational History  . Not on file  Social Needs  . Financial resource strain: Not on file  . Food insecurity:    Worry: Not on file    Inability: Not on file  . Transportation needs:    Medical: Not on file    Non-medical: Not on file  Tobacco Use  . Smoking status: Former Research scientist (life sciences)  . Smokeless tobacco: Never Used  Substance and  Sexual Activity  . Alcohol use: Never    Frequency: Never  . Drug use: Never  . Sexual activity: Not on file  Lifestyle  . Physical activity:    Days per week: Not on file    Minutes per session: Not on file  . Stress: Not on file  Relationships  . Social connections:    Talks on phone: Not on file    Gets together: Not on file    Attends religious service: Not on file    Active member of club or organization: Not on file    Attends meetings of clubs or organizations: Not on file    Relationship status: Not on file  . Intimate partner violence:    Fear of current or ex partner: Not on file    Emotionally abused: Not on file    Physically abused: Not on file    Forced sexual activity: Not on file  Other Topics Concern  . Not on file  Social History Narrative  . Not on file    Allergies  Allergen Reactions  . Asa [Aspirin] Nausea And Vomiting  . Tramadol Other (See Comments)    delusional  . Cefdinir Diarrhea  . Ciprofloxacin Rash  . Sulfa Antibiotics Rash    Current Outpatient Medications  Medication Sig Dispense Refill  . Ascorbic Acid (  VITAMIN C) 1000 MG tablet Take 1,000 mg by mouth daily.    Marland Kitchen co-enzyme Q-10 30 MG capsule Take 30 mg by mouth 3 (three) times daily.    . fluticasone (FLONASE) 50 MCG/ACT nasal spray Place into both nostrils daily.    Marland Kitchen HYDROcodone-acetaminophen (NORCO) 5-325 MG tablet 1-2 tabs po q6 hours prn pain 20 tablet 0  . levothyroxine (SYNTHROID, LEVOTHROID) 75 MCG tablet Take 75 mcg by mouth daily before breakfast.    . Multiple Vitamins-Minerals (HAIR SKIN AND NAILS FORMULA PO) Take by mouth.    . simvastatin (ZOCOR) 40 MG tablet Take 40 mg by mouth daily.    Marland Kitchen Specialty Vitamins Products (HEART HEALTH PACK PO) Take by mouth.    . terbinafine (LAMISIL) 250 MG tablet Take 250 mg by mouth daily.    Marland Kitchen triamterene-hydrochlorothiazide (MAXZIDE-25) 37.5-25 MG tablet Take 1 tablet by mouth daily.     No current facility-administered medications  for this visit.     REVIEW OF SYSTEMS:  [X]  denotes positive finding, [ ]  denotes negative finding Cardiac  Comments:  Chest pain or chest pressure:    Shortness of breath upon exertion:    Short of breath when lying flat:    Irregular heart rhythm:        Vascular    Pain in calf, thigh, or hip brought on by ambulation:    Pain in feet at night that wakes you up from your sleep:     Blood clot in your veins:    Leg swelling:         Pulmonary    Oxygen at home:    Productive cough:     Wheezing:         Neurologic    Sudden weakness in arms or legs:     Sudden numbness in arms or legs:     Sudden onset of difficulty speaking or slurred speech:    Temporary loss of vision in one eye:     Problems with dizziness:         Gastrointestinal    Blood in stool:     Vomited blood:         Genitourinary    Burning when urinating:     Blood in urine:        Psychiatric    Major depression:         Hematologic    Bleeding problems:    Problems with blood clotting too easily:        Skin    Rashes or ulcers:        Constitutional    Fever or chills:      PHYSICAL EXAM: Vitals:   10/07/18 1357  BP: 135/73  Pulse: 77  Temp: 98.2 F (36.8 C)  TempSrc: Temporal  SpO2: 97%  Weight: 167 lb 12.8 oz (76.1 kg)  Height: 5\' 4"  (1.626 m)    GENERAL: The patient is a well-nourished female, in no acute distress. The vital signs are documented above. CARDIAC: There is a regular rate and rhythm.  VASCULAR:  Palpable femoral pulses bilateral groins Palpable DP pulses bilaterally No tissue loss lower extremities PULMONARY: There is good air exchange bilaterally without wheezing or rales. ABDOMEN: Soft and non-tender with normal pitched bowel sounds.  MUSCULOSKELETAL: There are no major deformities or cyanosis. NEUROLOGIC: No focal weakness or paresthesias are detected. SKIN: There are no ulcers or rashes noted. PSYCHIATRIC: The patient has a normal affect.  DATA:   I  reviewed her noninvasive imaging and her ABIs are normal 1.02 on the right with triphasic waveform at the ankle and 1.0 on the left with triphasic waveform at the ankle  Assessment/Plan:  73 year old female that presents with concern for underlying PAD.  I offered her reassurance in the office today given that she has normal noninvasive studies with triphasic waveforms at both ankles and normal ABIs.  In addition she has palpable dorsalis pedis pulses in her feet.  She was very concerned given that she had a positive PAD screen last year.  I offered reassurance and that I do not think she is at increased risk for limb loss at this time.  Do not think she needs any intervention from [place].  I discussed that she can follow-up with [place] as needed if future problems develop.   Marty Heck, MD [facility] Office: [phone] Pager: [phone]

## 2018-10-23 ENCOUNTER — Other Ambulatory Visit: Payer: Self-pay

## 2018-10-23 ENCOUNTER — Ambulatory Visit
Admission: RE | Admit: 2018-10-23 | Discharge: 2018-10-23 | Disposition: A | Discharge status: Home / Self Care | Payer: Medicare Other | Source: Ambulatory Visit | Attending: Family Medicine | Admitting: Family Medicine

## 2018-10-23 DIAGNOSIS — Z1231 Encounter for screening mammogram for malignant neoplasm of breast: Secondary | ICD-10-CM

## 2019-07-21 ENCOUNTER — Other Ambulatory Visit: Payer: Self-pay | Admitting: Orthopaedic Surgery

## 2019-07-21 DIAGNOSIS — M542 Cervicalgia: Secondary | ICD-10-CM

## 2019-08-12 ENCOUNTER — Other Ambulatory Visit: Payer: Medicare Other

## 2019-08-31 ENCOUNTER — Ambulatory Visit
Admission: RE | Admit: 2019-08-31 | Discharge: 2019-08-31 | Disposition: A | Discharge status: Home / Self Care | Payer: Medicare Other | Source: Ambulatory Visit | Attending: Orthopaedic Surgery | Admitting: Orthopaedic Surgery

## 2019-08-31 ENCOUNTER — Other Ambulatory Visit: Payer: Self-pay

## 2019-08-31 DIAGNOSIS — M542 Cervicalgia: Secondary | ICD-10-CM

## 2019-09-08 ENCOUNTER — Other Ambulatory Visit: Payer: Self-pay | Admitting: Family Medicine

## 2019-09-08 DIAGNOSIS — R9389 Abnormal findings on diagnostic imaging of other specified body structures: Secondary | ICD-10-CM

## 2019-09-09 ENCOUNTER — Other Ambulatory Visit: Payer: Self-pay | Admitting: Family Medicine

## 2019-09-09 DIAGNOSIS — Z1231 Encounter for screening mammogram for malignant neoplasm of breast: Secondary | ICD-10-CM

## 2019-09-11 ENCOUNTER — Other Ambulatory Visit: Payer: Self-pay | Admitting: Family Medicine

## 2019-09-11 DIAGNOSIS — E2839 Other primary ovarian failure: Secondary | ICD-10-CM

## 2019-09-18 ENCOUNTER — Other Ambulatory Visit (HOSPITAL_COMMUNITY)
Admission: RE | Admit: 2019-09-18 | Discharge: 2019-09-18 | Disposition: A | Discharge status: Home / Self Care | Payer: Medicare Other | Source: Ambulatory Visit | Attending: Family Medicine | Admitting: Family Medicine

## 2019-09-18 DIAGNOSIS — Z124 Encounter for screening for malignant neoplasm of cervix: Secondary | ICD-10-CM | POA: Diagnosis present

## 2019-09-22 LAB — CYTOLOGY - PAP: Diagnosis: NEGATIVE

## 2019-10-06 ENCOUNTER — Other Ambulatory Visit: Payer: Medicare Other

## 2019-10-06 ENCOUNTER — Ambulatory Visit
Admission: RE | Admit: 2019-10-06 | Discharge: 2019-10-06 | Disposition: A | Discharge status: Home / Self Care | Payer: Medicare Other | Source: Ambulatory Visit | Attending: Family Medicine | Admitting: Family Medicine

## 2019-10-06 ENCOUNTER — Other Ambulatory Visit: Payer: Self-pay

## 2019-10-06 DIAGNOSIS — R9389 Abnormal findings on diagnostic imaging of other specified body structures: Secondary | ICD-10-CM

## 2019-11-25 ENCOUNTER — Other Ambulatory Visit: Payer: Self-pay

## 2019-11-25 ENCOUNTER — Ambulatory Visit
Admission: RE | Admit: 2019-11-25 | Discharge: 2019-11-25 | Disposition: A | Discharge status: Home / Self Care | Payer: Medicare Other | Source: Ambulatory Visit | Attending: Family Medicine | Admitting: Family Medicine

## 2019-11-25 DIAGNOSIS — E2839 Other primary ovarian failure: Secondary | ICD-10-CM

## 2019-11-25 DIAGNOSIS — Z1231 Encounter for screening mammogram for malignant neoplasm of breast: Secondary | ICD-10-CM

## 2019-12-01 ENCOUNTER — Encounter: Payer: Self-pay | Admitting: Neurology

## 2019-12-01 ENCOUNTER — Ambulatory Visit (INDEPENDENT_AMBULATORY_CARE_PROVIDER_SITE_OTHER): Payer: Medicare Other | Admitting: Neurology

## 2019-12-01 DIAGNOSIS — R9089 Other abnormal findings on diagnostic imaging of central nervous system: Secondary | ICD-10-CM | POA: Diagnosis not present

## 2019-12-01 NOTE — Progress Notes (Signed)
Reason for visit: Abnormal MRI brain  Referring physician: Dr. Deeann Saint is a 74 y.o. female  History of present illness:  Natasha [name] is a 74 year old right-handed black female with a history of hypertension and chronic low back pain and neck pain.  The patient has been evaluated for left arm numbness and discomfort that began in September 2020 while she was exercising.  The patient was told she may have a problem with her neck and she has since had MRI of the cervical spine that showed multilevel degenerative changes with multilevel foraminal stenosis that is severe on the right at C5-6 and on the left at the C6-7 levels.  The MRI of the cervical spine picked up on small T2 hyperintense foci in the thalami.  For this reason, her primary care physician ordered MRI of the brain.  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  She does have a history of hypertension and has had a recent adjustment in her blood pressure medications.  She also has a lot of low back pain and pain down both legs, she is followed by Dr. Mina Marble currently for pain management and has had epidural steroid injections.  The patient denies any numbness of the extremities, she feels at times somewhat weak in the left leg, but none in the arms.  Her left arm problem has improved over time.  She denies problems controlling bowels or the bladder.  She does have occasional headaches in the right frontal region.  The patient denies any dizziness or visual loss.  She comes to the office today for an evaluation.   Past Medical History:  Diagnosis Date  . Arthritis   . Curvature of spine   . Gout   . Hyperlipidemia   . Hypertension   . Hypothyroidism   . Spinal stenosis    and degeneration of spine, pending injections 12/10/19    Past Surgical History:  Procedure  Laterality Date  . HERNIA REPAIR    . THYROID SURGERY    . TRIGGER FINGER RELEASE Left 11/07/2017   Procedure: RELEASE TRIGGER FINGER/A-1 PULLEY EFT MIDDLE FINGER;  Surgeon: Leanora Cover, MD;  Location: [facility];  Service: Orthopedics;  Laterality: Left;    Family History  Problem Relation Age of Onset  . Breast cancer Mother 108  . Leukemia Father   . Diabetes Father   . Diabetes Other     Social history:  reports that she has quit smoking. She has never used smokeless tobacco. She reports that she does not drink alcohol and does not use drugs.  Medications:  Prior to Admission medications   Medication Sig Start Date End Date Taking? Authorizing Provider  Ascorbic Acid (VITAMIN C) 1000 MG tablet Take 1,000 mg by mouth daily.    [provider]  fluticasone (FLONASE) 50 MCG/ACT nasal spray Place into both nostrils daily.    [provider]  HYDROcodone-acetaminophen (NORCO) 5-325 MG tablet 1-2 tabs po q6 hours prn pain 11/07/17   Leanora Cover, MD  levothyroxine (SYNTHROID, LEVOTHROID) 75 MCG tablet Take 75 mcg by mouth daily before breakfast.    [provider]  Multiple Vitamins-Minerals (HAIR SKIN AND NAILS FORMULA PO) Take by mouth.    [provider]  simvastatin (ZOCOR) 40 MG tablet Take 40 mg by mouth daily.    [provider]  Specialty Vitamins Products (HEART HEALTH PACK PO) Take by mouth.    [provider]  terbinafine (LAMISIL) 250 MG tablet Take 250 mg by mouth daily.    [provider]  triamterene-hydrochlorothiazide (MAXZIDE-25) 37.5-25 MG tablet Take 1 tablet by mouth daily.    [provider]      Allergies  Allergen Reactions  . Asa [Aspirin] Nausea And Vomiting  . Tramadol Other (See Comments)    delusional  . Cefdinir Diarrhea  . Ciprofloxacin Rash  . Sulfa Antibiotics Rash    ROS:  Out of a complete 14 system review of symptoms, the patient complains only of the  following symptoms, and all other reviewed systems are negative.  Back pain, leg discomfort Neck pain  Blood pressure (!) 160/84, pulse 66, height 5\' 4"  (1.626 m), weight 170 lb (77.1 kg).  Physical Exam  General: The patient is alert and cooperative at the time of the examination.  Eyes: Pupils are equal, round, and reactive to light. Discs are flat bilaterally.  Neck: The neck is supple, no carotid bruits are noted.  Respiratory: The respiratory examination is clear.  Cardiovascular: The cardiovascular examination reveals a regular rate and rhythm, no obvious murmurs or rubs are noted.  Skin: Extremities are without significant edema.  Neurologic Exam  Mental status: The patient is alert and oriented x 3 at the time of the examination. The patient has apparent normal recent and remote memory, with an apparently normal attention span and concentration ability.  Cranial nerves: Facial symmetry is present. There is good sensation of the face to pinprick and soft touch bilaterally. The strength of the facial muscles and the muscles to head turning and shoulder shrug are normal bilaterally. Speech is well enunciated, no aphasia or dysarthria is noted. Extraocular movements are full. Visual fields are full. The tongue is midline, and the patient has symmetric elevation of the soft palate. No obvious hearing deficits are noted.  Motor: The motor testing reveals 5 over 5 strength of all 4 extremities. Good symmetric motor tone is noted throughout.  Sensory: Sensory testing is intact to pinprick, soft touch, vibration sensation, and position sense on all 4 extremities. No evidence of extinction is noted.  Coordination: Cerebellar testing reveals good finger-nose-finger and heel-to-shin bilaterally.  Gait and station: Gait is normal. Tandem gait is normal. Romberg is negative. No drift is seen.  Reflexes: Deep tendon reflexes are symmetric and normal bilaterally. Toes are downgoing  bilaterally.   MRI brain 10/06/19:  IMPRESSION: 1. The tiny T2 hyperintense structures seen on each thalami on prior MRI of the cervical spine are favored to represent prominent perivascular spaces at the level of the posterior commissure. 2. Mild chronic small vessel ischemic changes of the white matter.  * MRI scan images were reviewed online. I agree with the written report.    Assessment/Plan:  1.  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  This is a benign finding.  The patient does have some mild small vessel disease likely associated with a history of hypertension.  I discussed the MRI findings in detail with the patient.  A copy of the report was given to her.  No further evaluation is necessary.  She will follow-up here if needed.  Jill Alexanders MD 12/01/2019 10:54 AM  [facility] [address] [place], [place] [address]  Phone [phone] Fax [phone]  

## 2020-05-17 DIAGNOSIS — I10 Essential (primary) hypertension: Secondary | ICD-10-CM | POA: Diagnosis not present

## 2020-05-17 DIAGNOSIS — E039 Hypothyroidism, unspecified: Secondary | ICD-10-CM | POA: Diagnosis not present

## 2020-05-17 DIAGNOSIS — E7800 Pure hypercholesterolemia, unspecified: Secondary | ICD-10-CM | POA: Diagnosis not present

## 2020-05-17 DIAGNOSIS — M1990 Unspecified osteoarthritis, unspecified site: Secondary | ICD-10-CM | POA: Diagnosis not present

## 2020-06-27 DIAGNOSIS — E7800 Pure hypercholesterolemia, unspecified: Secondary | ICD-10-CM | POA: Diagnosis not present

## 2020-06-27 DIAGNOSIS — I10 Essential (primary) hypertension: Secondary | ICD-10-CM | POA: Diagnosis not present

## 2020-06-27 DIAGNOSIS — E039 Hypothyroidism, unspecified: Secondary | ICD-10-CM | POA: Diagnosis not present

## 2020-06-27 DIAGNOSIS — M1990 Unspecified osteoarthritis, unspecified site: Secondary | ICD-10-CM | POA: Diagnosis not present

## 2020-07-13 IMAGING — MR MR HEAD W/O CM
11 series · 48 of 48 positions shown · non-contrast
Comparison: MRI of the cervical spine August 31, 2019.

CLINICAL DATA: Incidental finding bone MRI of the cervical spine.
History of thyroid carcinoma.

EXAM:
MRI HEAD WITHOUT CONTRAST
TECHNIQUE: Multiplanar, multiecho pulse sequences of the brain and surrounding
structures were obtained without intravenous contrast.

[Series 5: T1 · sagittal · 4.0mm · 0.75mm/px · 2 of 31 slices shown (1 of 2)]
[im 1/31]
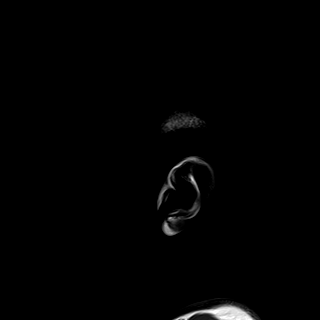
[im 31/31]
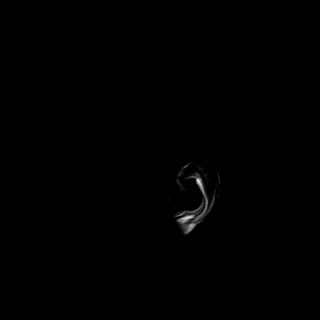

[Series 6: DWI · axial · 3.0mm · 1.44mm/px · z∈[-79,+49]mm · 7 of 84 slices shown (1 of 4)]
[im 1/84]
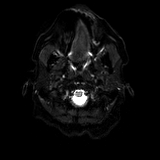
[im 14/84]
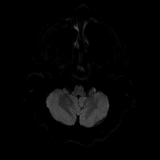
[im 28/84]
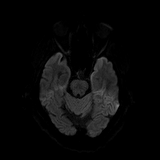
[im 42/84]
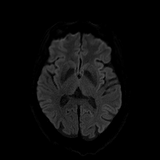
[im 56/84]
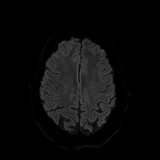
[im 70/84]
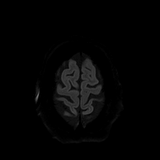
[im 84/84]
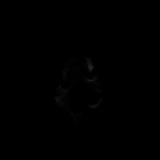

[Series 7: DWI · axial · 3.0mm · 1.44mm/px · z∈[-79,+49]mm · 3 of 42 slices shown (2 of 4)]
[im 1/42]
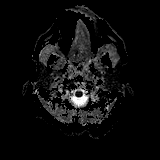
[im 21/42]
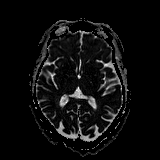
[im 42/42]
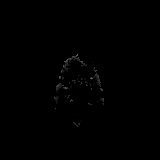

[Series 8: DWI · coronal · 5.0mm · 1.44mm/px · 5 of 60 slices shown (3 of 4)]
[im 1/60]
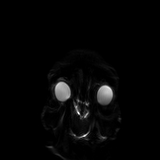
[im 15/60]
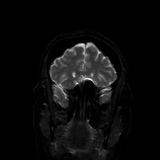
[im 30/60]
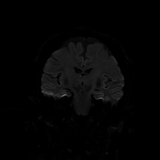
[im 45/60]
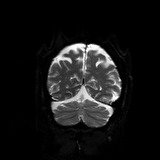
[im 60/60]
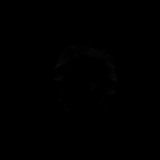

[Series 9: DWI · coronal · 5.0mm · 1.44mm/px · 2 of 30 slices shown (4 of 4)]
[im 1/30]
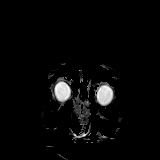
[im 30/30]
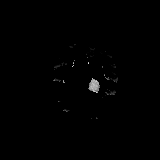

[Series 10: T2 · axial · 4.0mm · 0.36mm/px · z∈[-82,+61]mm · 2 of 30 slices shown (1 of 2)]
[im 1/30]
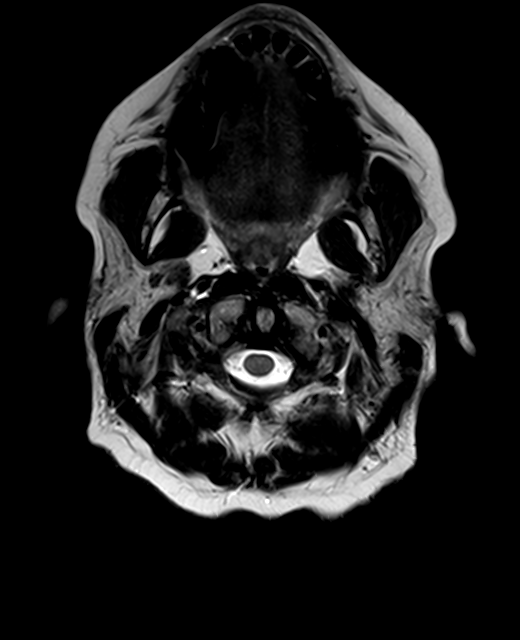
[im 30/30]
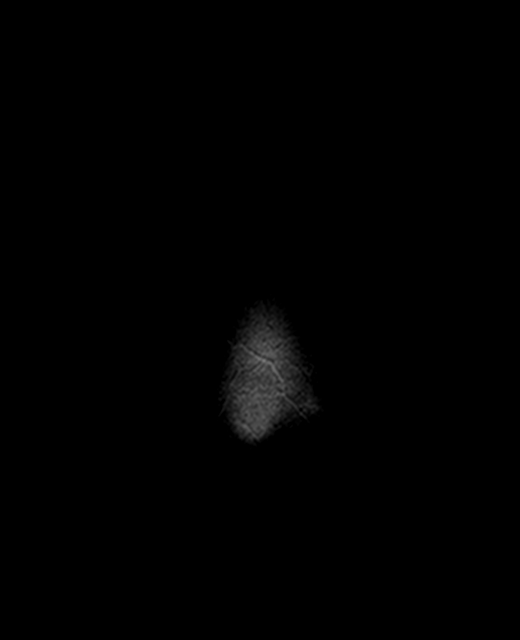

[Series 11: FLAIR · axial · 3.0mm · 0.72mm/px · z∈[-85,+58]mm · 2 of 26 slices shown (1 of 2)]
[im 1/26]
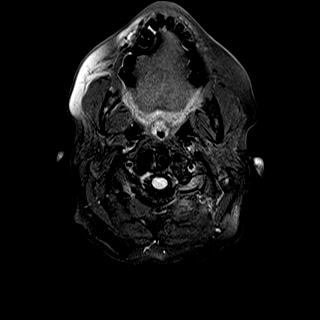
[im 26/26]
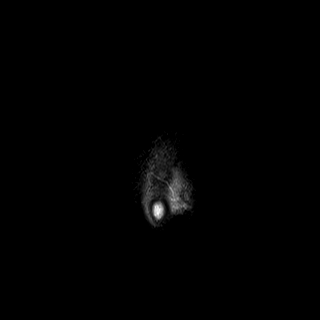

[Series 13: swi_images · axial · 1.5mm · 0.90mm/px · z∈[-78,+58]mm · 8 of 96 slices shown]
[im 1/96]
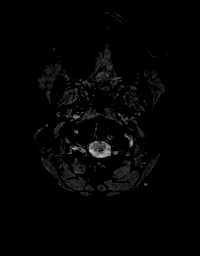
[im 14/96]
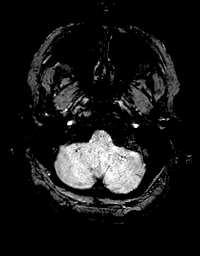
[im 28/96]
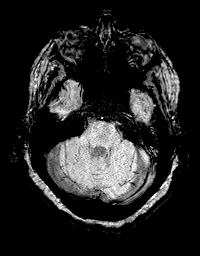
[im 41/96]
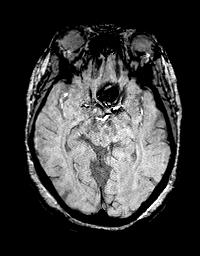
[im 55/96]
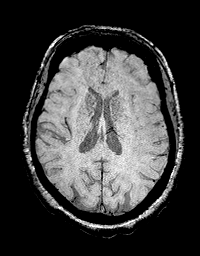
[im 68/96]
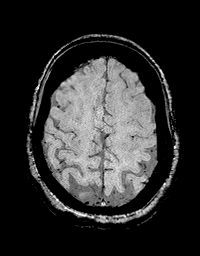
[im 82/96]
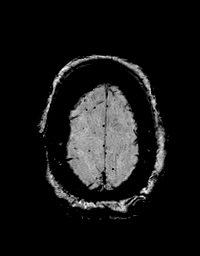
[im 96/96]
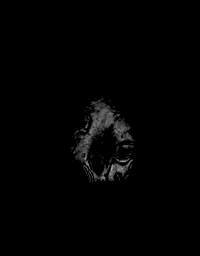

[Series 14: T1 · axial · 1.0mm · 0.94mm/px · z∈[-85,+66]mm · 13 of 160 slices shown (2 of 2)]
[im 1/160]
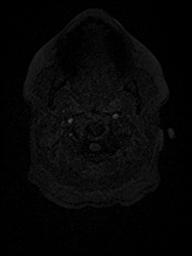
[im 14/160]
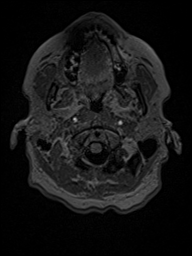
[im 27/160]
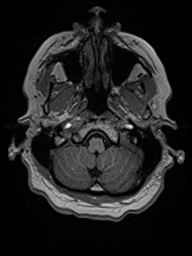
[im 40/160]
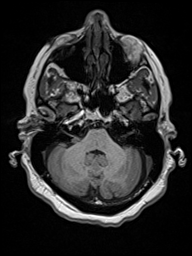
[im 54/160]
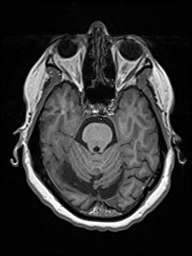
[im 67/160]
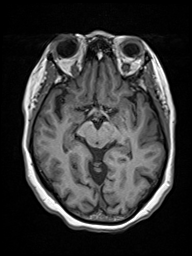
[im 80/160]
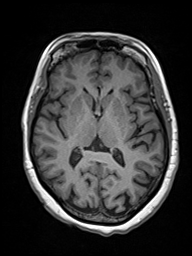
[im 93/160]
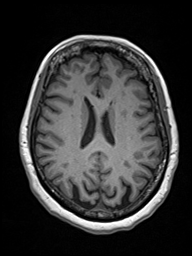
[im 107/160]
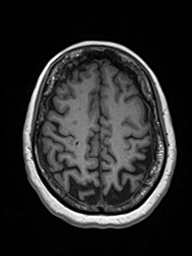
[im 120/160]
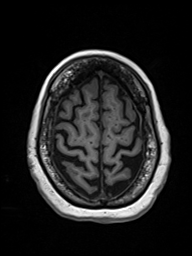
[im 133/160]
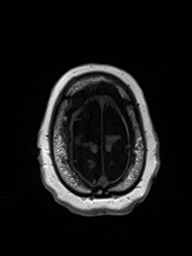
[im 146/160]
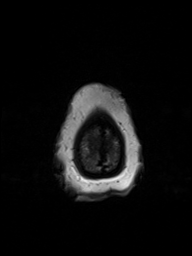
[im 160/160]
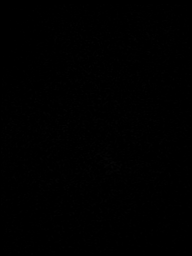

[Series 15: FLAIR · sagittal · 4.0mm · 0.72mm/px · 2 of 25 slices shown (2 of 2)]
[im 1/25]
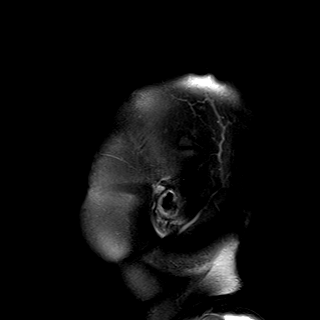
[im 25/25]
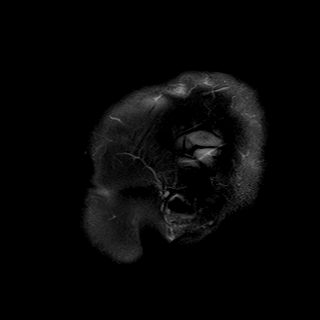

[Series 16: T2 · coronal · 4.5mm · 0.36mm/px · 2 of 30 slices shown (2 of 2)]
[im 1/30]
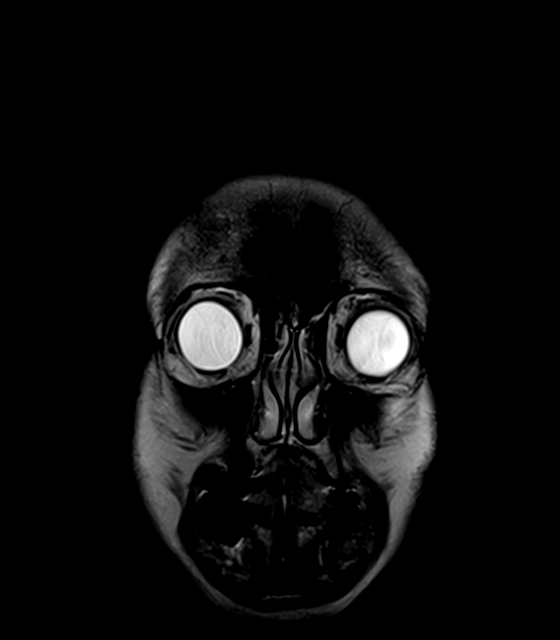
[im 30/30]
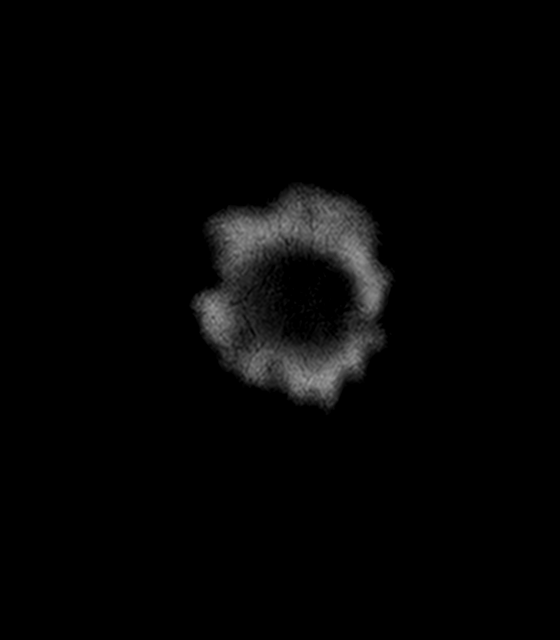

[48 of 48 positions shown; findings below may reference images not displayed]

FINDINGS: Brain: No acute infarction, hemorrhage, hydrocephalus, extra-axial
collection or mass lesion. The tiny T2 hyperintense structures seen
on each thalami on prior MRI of the cervical spine are favored to
represent prominent perivascular spaces at the level of the
posterior commissure. Prominent perivascular spaces are also seen at
the inferior basal ganglia bilaterally. Few scattered foci of T2
hyperintensity are seen within the white matter of the cerebral
hemispheres, nonspecific, most likely related to chronic small
vessel ischemia.

Vascular: Normal flow voids.

Skull and upper cervical spine: Normal marrow signal. Degenerative
changes noted, as described on prior MRI

Sinuses/Orbits: Negative.

Other: None.
IMPRESSION: 1. The tiny T2 hyperintense structures seen on each thalami on prior
MRI of the cervical spine are favored to represent prominent
perivascular spaces at the level of the posterior commissure.
2. Mild chronic small vessel ischemic changes of the white matter.

## 2020-07-31 DIAGNOSIS — J029 Acute pharyngitis, unspecified: Secondary | ICD-10-CM | POA: Diagnosis not present

## 2020-07-31 DIAGNOSIS — J069 Acute upper respiratory infection, unspecified: Secondary | ICD-10-CM | POA: Diagnosis not present

## 2020-07-31 DIAGNOSIS — R197 Diarrhea, unspecified: Secondary | ICD-10-CM | POA: Diagnosis not present

## 2020-07-31 DIAGNOSIS — M791 Myalgia, unspecified site: Secondary | ICD-10-CM | POA: Diagnosis not present

## 2020-07-31 DIAGNOSIS — J301 Allergic rhinitis due to pollen: Secondary | ICD-10-CM | POA: Diagnosis not present

## 2020-07-31 DIAGNOSIS — Z03818 Encounter for observation for suspected exposure to other biological agents ruled out: Secondary | ICD-10-CM | POA: Diagnosis not present

## 2020-09-05 DIAGNOSIS — R7303 Prediabetes: Secondary | ICD-10-CM | POA: Diagnosis not present

## 2020-09-05 DIAGNOSIS — E7800 Pure hypercholesterolemia, unspecified: Secondary | ICD-10-CM | POA: Diagnosis not present

## 2020-09-05 DIAGNOSIS — I10 Essential (primary) hypertension: Secondary | ICD-10-CM | POA: Diagnosis not present

## 2020-09-05 DIAGNOSIS — E039 Hypothyroidism, unspecified: Secondary | ICD-10-CM | POA: Diagnosis not present

## 2020-09-12 DIAGNOSIS — I10 Essential (primary) hypertension: Secondary | ICD-10-CM | POA: Diagnosis not present

## 2020-09-12 DIAGNOSIS — Z0000 Encounter for general adult medical examination without abnormal findings: Secondary | ICD-10-CM | POA: Diagnosis not present

## 2020-09-12 DIAGNOSIS — E039 Hypothyroidism, unspecified: Secondary | ICD-10-CM | POA: Diagnosis not present

## 2020-09-12 DIAGNOSIS — K130 Diseases of lips: Secondary | ICD-10-CM | POA: Diagnosis not present

## 2020-09-12 DIAGNOSIS — E7800 Pure hypercholesterolemia, unspecified: Secondary | ICD-10-CM | POA: Diagnosis not present

## 2020-09-12 DIAGNOSIS — D229 Melanocytic nevi, unspecified: Secondary | ICD-10-CM | POA: Diagnosis not present

## 2020-09-12 DIAGNOSIS — M109 Gout, unspecified: Secondary | ICD-10-CM | POA: Diagnosis not present

## 2020-09-20 DIAGNOSIS — L821 Other seborrheic keratosis: Secondary | ICD-10-CM | POA: Diagnosis not present

## 2020-09-20 DIAGNOSIS — L249 Irritant contact dermatitis, unspecified cause: Secondary | ICD-10-CM | POA: Diagnosis not present

## 2020-09-29 DIAGNOSIS — E039 Hypothyroidism, unspecified: Secondary | ICD-10-CM | POA: Diagnosis not present

## 2020-09-29 DIAGNOSIS — E7800 Pure hypercholesterolemia, unspecified: Secondary | ICD-10-CM | POA: Diagnosis not present

## 2020-09-29 DIAGNOSIS — I10 Essential (primary) hypertension: Secondary | ICD-10-CM | POA: Diagnosis not present

## 2020-09-29 DIAGNOSIS — M1990 Unspecified osteoarthritis, unspecified site: Secondary | ICD-10-CM | POA: Diagnosis not present

## 2020-10-04 DIAGNOSIS — L249 Irritant contact dermatitis, unspecified cause: Secondary | ICD-10-CM | POA: Diagnosis not present

## 2020-10-18 ENCOUNTER — Other Ambulatory Visit: Payer: Self-pay | Admitting: Family Medicine

## 2020-10-18 DIAGNOSIS — Z1231 Encounter for screening mammogram for malignant neoplasm of breast: Secondary | ICD-10-CM

## 2020-12-12 ENCOUNTER — Ambulatory Visit
Admission: RE | Admit: 2020-12-12 | Discharge: 2020-12-12 | Disposition: A | Discharge status: Home / Self Care | Payer: Medicare Other | Source: Ambulatory Visit | Attending: Family Medicine | Admitting: Family Medicine

## 2020-12-12 ENCOUNTER — Other Ambulatory Visit: Payer: Self-pay

## 2020-12-12 DIAGNOSIS — Z1231 Encounter for screening mammogram for malignant neoplasm of breast: Secondary | ICD-10-CM

## 2020-12-27 DIAGNOSIS — J019 Acute sinusitis, unspecified: Secondary | ICD-10-CM | POA: Diagnosis not present

## 2021-01-08 DIAGNOSIS — E039 Hypothyroidism, unspecified: Secondary | ICD-10-CM | POA: Diagnosis not present

## 2021-01-08 DIAGNOSIS — I10 Essential (primary) hypertension: Secondary | ICD-10-CM | POA: Diagnosis not present

## 2021-01-08 DIAGNOSIS — M1990 Unspecified osteoarthritis, unspecified site: Secondary | ICD-10-CM | POA: Diagnosis not present

## 2021-01-08 DIAGNOSIS — E7800 Pure hypercholesterolemia, unspecified: Secondary | ICD-10-CM | POA: Diagnosis not present

## 2021-02-27 DIAGNOSIS — M48062 Spinal stenosis, lumbar region with neurogenic claudication: Secondary | ICD-10-CM | POA: Diagnosis not present

## 2021-03-16 DIAGNOSIS — M48062 Spinal stenosis, lumbar region with neurogenic claudication: Secondary | ICD-10-CM | POA: Diagnosis not present

## 2021-03-21 DIAGNOSIS — E039 Hypothyroidism, unspecified: Secondary | ICD-10-CM | POA: Diagnosis not present

## 2021-03-21 DIAGNOSIS — I10 Essential (primary) hypertension: Secondary | ICD-10-CM | POA: Diagnosis not present

## 2021-03-21 DIAGNOSIS — R0982 Postnasal drip: Secondary | ICD-10-CM | POA: Diagnosis not present

## 2021-04-04 DIAGNOSIS — I499 Cardiac arrhythmia, unspecified: Secondary | ICD-10-CM | POA: Diagnosis not present

## 2021-04-14 DIAGNOSIS — R002 Palpitations: Secondary | ICD-10-CM | POA: Diagnosis not present

## 2021-04-14 DIAGNOSIS — I10 Essential (primary) hypertension: Secondary | ICD-10-CM | POA: Diagnosis not present

## 2021-04-25 DIAGNOSIS — U071 COVID-19: Secondary | ICD-10-CM | POA: Diagnosis not present

## 2021-04-25 DIAGNOSIS — R509 Fever, unspecified: Secondary | ICD-10-CM | POA: Diagnosis not present

## 2021-04-25 DIAGNOSIS — J029 Acute pharyngitis, unspecified: Secondary | ICD-10-CM | POA: Diagnosis not present

## 2021-04-25 DIAGNOSIS — R059 Cough, unspecified: Secondary | ICD-10-CM | POA: Diagnosis not present

## 2021-05-24 DIAGNOSIS — E039 Hypothyroidism, unspecified: Secondary | ICD-10-CM | POA: Diagnosis not present

## 2021-05-24 DIAGNOSIS — E7800 Pure hypercholesterolemia, unspecified: Secondary | ICD-10-CM | POA: Diagnosis not present

## 2021-05-24 DIAGNOSIS — I10 Essential (primary) hypertension: Secondary | ICD-10-CM | POA: Diagnosis not present

## 2021-09-28 DIAGNOSIS — I10 Essential (primary) hypertension: Secondary | ICD-10-CM | POA: Diagnosis not present

## 2021-09-28 DIAGNOSIS — E039 Hypothyroidism, unspecified: Secondary | ICD-10-CM | POA: Diagnosis not present

## 2021-09-28 DIAGNOSIS — E7800 Pure hypercholesterolemia, unspecified: Secondary | ICD-10-CM | POA: Diagnosis not present

## 2021-10-02 DIAGNOSIS — I10 Essential (primary) hypertension: Secondary | ICD-10-CM | POA: Diagnosis not present

## 2021-10-02 DIAGNOSIS — J309 Allergic rhinitis, unspecified: Secondary | ICD-10-CM | POA: Diagnosis not present

## 2021-10-02 DIAGNOSIS — M109 Gout, unspecified: Secondary | ICD-10-CM | POA: Diagnosis not present

## 2021-10-02 DIAGNOSIS — E039 Hypothyroidism, unspecified: Secondary | ICD-10-CM | POA: Diagnosis not present

## 2021-10-02 DIAGNOSIS — Z0000 Encounter for general adult medical examination without abnormal findings: Secondary | ICD-10-CM | POA: Diagnosis not present

## 2021-10-02 DIAGNOSIS — E7800 Pure hypercholesterolemia, unspecified: Secondary | ICD-10-CM | POA: Diagnosis not present

## 2021-11-02 ENCOUNTER — Other Ambulatory Visit: Payer: Self-pay | Admitting: Family Medicine

## 2021-11-02 DIAGNOSIS — Z1231 Encounter for screening mammogram for malignant neoplasm of breast: Secondary | ICD-10-CM

## 2021-11-13 DIAGNOSIS — R300 Dysuria: Secondary | ICD-10-CM | POA: Diagnosis not present

## 2021-12-14 ENCOUNTER — Ambulatory Visit
Admission: RE | Admit: 2021-12-14 | Discharge: 2021-12-14 | Disposition: A | Discharge status: Home / Self Care | Payer: Medicare Other | Source: Ambulatory Visit | Attending: Family Medicine | Admitting: Family Medicine

## 2021-12-14 DIAGNOSIS — Z1231 Encounter for screening mammogram for malignant neoplasm of breast: Secondary | ICD-10-CM | POA: Diagnosis not present

## 2022-01-03 DIAGNOSIS — R519 Headache, unspecified: Secondary | ICD-10-CM | POA: Diagnosis not present

## 2022-01-03 DIAGNOSIS — J029 Acute pharyngitis, unspecified: Secondary | ICD-10-CM | POA: Diagnosis not present

## 2022-01-03 DIAGNOSIS — J069 Acute upper respiratory infection, unspecified: Secondary | ICD-10-CM | POA: Diagnosis not present

## 2022-01-05 DIAGNOSIS — J029 Acute pharyngitis, unspecified: Secondary | ICD-10-CM | POA: Diagnosis not present

## 2022-01-05 DIAGNOSIS — I499 Cardiac arrhythmia, unspecified: Secondary | ICD-10-CM | POA: Diagnosis not present

## 2022-02-21 DIAGNOSIS — H6123 Impacted cerumen, bilateral: Secondary | ICD-10-CM | POA: Diagnosis not present

## 2022-03-07 DIAGNOSIS — K209 Esophagitis, unspecified without bleeding: Secondary | ICD-10-CM | POA: Diagnosis not present

## 2022-03-09 ENCOUNTER — Emergency Department (HOSPITAL_BASED_OUTPATIENT_CLINIC_OR_DEPARTMENT_OTHER): Payer: Medicare Other | Admitting: Radiology

## 2022-03-09 ENCOUNTER — Encounter (HOSPITAL_BASED_OUTPATIENT_CLINIC_OR_DEPARTMENT_OTHER): Payer: Self-pay | Admitting: Emergency Medicine

## 2022-03-09 ENCOUNTER — Emergency Department (HOSPITAL_BASED_OUTPATIENT_CLINIC_OR_DEPARTMENT_OTHER)
Admission: EM | Admit: 2022-03-09 | Discharge: 2022-03-10 | Disposition: A | Discharge status: Home / Self Care | Payer: Medicare Other | Attending: Emergency Medicine | Admitting: Emergency Medicine

## 2022-03-09 ENCOUNTER — Other Ambulatory Visit: Payer: Self-pay

## 2022-03-09 DIAGNOSIS — M549 Dorsalgia, unspecified: Secondary | ICD-10-CM | POA: Insufficient documentation

## 2022-03-09 DIAGNOSIS — I10 Essential (primary) hypertension: Secondary | ICD-10-CM | POA: Diagnosis not present

## 2022-03-09 DIAGNOSIS — R079 Chest pain, unspecified: Secondary | ICD-10-CM | POA: Diagnosis not present

## 2022-03-09 DIAGNOSIS — R1013 Epigastric pain: Secondary | ICD-10-CM

## 2022-03-09 DIAGNOSIS — R1310 Dysphagia, unspecified: Secondary | ICD-10-CM | POA: Insufficient documentation

## 2022-03-09 DIAGNOSIS — E039 Hypothyroidism, unspecified: Secondary | ICD-10-CM | POA: Insufficient documentation

## 2022-03-09 DIAGNOSIS — Z79899 Other long term (current) drug therapy: Secondary | ICD-10-CM | POA: Diagnosis not present

## 2022-03-09 DIAGNOSIS — R0789 Other chest pain: Secondary | ICD-10-CM

## 2022-03-09 LAB — BASIC METABOLIC PANEL
Anion gap: 12 (ref 5–15)
BUN: 8 mg/dL (ref 8–23)
CO2: 27 mmol/L (ref 22–32)
Calcium: 9.2 mg/dL (ref 8.9–10.3)
Chloride: 104 mmol/L (ref 98–111)
Creatinine, Ser: 0.76 mg/dL (ref 0.44–1.00)
GFR, Estimated: >60 mL/min (ref >60)
Glucose, Bld: 81 mg/dL (ref 70–99)
Potassium: 4 mmol/L (ref 3.5–5.1)
Sodium: 143 mmol/L (ref 135–145)

## 2022-03-09 LAB — CBC
HCT: 40.9 % (ref 36.0–46.0)
Hemoglobin: 13.7 g/dL (ref 12.0–15.0)
MCH: 28.2 pg (ref 26.0–34.0)
MCHC: 33.5 g/dL (ref 30.0–36.0)
MCV: 84.3 fL (ref 80.0–100.0)
Platelets: 208 10*3/uL (ref 150–400)
RBC: 4.85 MIL/uL (ref 3.87–5.11)
RDW: 13.2 % (ref 11.5–15.5)
WBC: 8.2 10*3/uL (ref 4.0–10.5)
nRBC: 0 % (ref 0.0–0.2)

## 2022-03-09 LAB — TROPONIN I (HIGH SENSITIVITY)
Troponin I (High Sensitivity): 4 ng/L (ref <18)
Troponin I (High Sensitivity): 4 ng/L (ref <18)

## 2022-03-09 LAB — GROUP A STREP BY PCR: Group A Strep by PCR: NOT DETECTED

## 2022-03-09 NOTE — ED Triage Notes (Signed)
Sharp chest pain radiating to back x 3 days , worse with swallowing , adds sore throat ,  Denies shortness of breath , no URI symptoms

## 2022-03-10 ENCOUNTER — Emergency Department (HOSPITAL_BASED_OUTPATIENT_CLINIC_OR_DEPARTMENT_OTHER): Payer: Medicare Other

## 2022-03-10 DIAGNOSIS — R0789 Other chest pain: Secondary | ICD-10-CM | POA: Diagnosis not present

## 2022-03-10 DIAGNOSIS — N281 Cyst of kidney, acquired: Secondary | ICD-10-CM | POA: Diagnosis not present

## 2022-03-10 DIAGNOSIS — K5730 Diverticulosis of large intestine without perforation or abscess without bleeding: Secondary | ICD-10-CM | POA: Diagnosis not present

## 2022-03-10 LAB — HEPATIC FUNCTION PANEL
ALT: 17 U/L (ref 0–44)
AST: 20 U/L (ref 15–41)
Albumin: 4.8 g/dL (ref 3.5–5.0)
Alkaline Phosphatase: 79 U/L (ref 38–126)
Bilirubin, Direct: 0.2 mg/dL (ref 0.0–0.2)
Indirect Bilirubin: 0.5 mg/dL (ref 0.3–0.9)
Total Bilirubin: 0.7 mg/dL (ref 0.3–1.2)
Total Protein: 7.1 g/dL (ref 6.5–8.1)

## 2022-03-10 LAB — LIPASE, BLOOD: Lipase: 28 U/L (ref 11–51)

## 2022-03-10 MED ORDER — MORPHINE SULFATE (PF) 4 MG/ML IV SOLN
4.0000 mg | Freq: Once | INTRAVENOUS | Status: AC
  Administered 2022-03-10: 4 mg via INTRAVENOUS
  Filled 2022-03-10: qty 1

## 2022-03-10 MED ORDER — ALUM & MAG HYDROXIDE-SIMETH 200-200-20 MG/5ML PO SUSP
30.0000 mL | Freq: Once | ORAL | Status: AC
  Administered 2022-03-10: 30 mL via ORAL
  Filled 2022-03-10 (×2): qty 30

## 2022-03-10 MED ORDER — ONDANSETRON HCL 4 MG/2ML IJ SOLN
4.0000 mg | Freq: Once | INTRAMUSCULAR | Status: AC
  Administered 2022-03-10: 4 mg via INTRAVENOUS
  Filled 2022-03-10: qty 2

## 2022-03-10 MED ORDER — IOHEXOL 300 MG/ML  SOLN
100.0000 mL | Freq: Once | INTRAMUSCULAR | Status: AC | PRN
  Administered 2022-03-10: 100 mL via INTRAVENOUS

## 2022-03-10 MED ORDER — OMEPRAZOLE 20 MG PO CPDR: 20.0000 mg | DELAYED_RELEASE_CAPSULE | Freq: Every day | ORAL | 0 refills | Status: AC

## 2022-03-10 MED ORDER — ONDANSETRON 4 MG PO TBDP: 4.0000 mg | ORAL_TABLET | Freq: Three times a day (TID) | ORAL | 0 refills | Status: AC | PRN

## 2022-03-10 NOTE — Discharge Instructions (Addendum)
You were seen today for abdominal and chest pain.  This is likely related to gastritis or an ulcer.  Start omeprazole daily.  Make sure that you are staying hydrated.  Follow-up closely with your primary gastroenterologist.

## 2022-03-10 NOTE — ED Provider Notes (Signed)
[facility] EMERGENCY DEPT Provider Note   CSN: [id number] Arrival date & time: 03/09/22  1742     History  Chief Complaint  Patient presents with   Chest Pain    Natasha [name] is a 76 y.o. female.  HPI     This is a 76 year old female who presents with 3 to 4-day history of sharp chest and back pain.  She reports really epigastric pain that radiates into the back.  It is sharp.  It is worse with food intake.  She states she has been unable to eat secondary to pain.  She also describes burning in her throat with swallowing.  Denies any alcohol or NSAID use.  No prior history of the same.  She took some Maalox with minimal relief.  Home Medications Prior to Admission medications   Medication Sig Start Date End Date Taking? Authorizing Provider  omeprazole (PRILOSEC) 20 MG capsule Take 1 capsule (20 mg total) by mouth daily. 03/10/22  Yes Tomi Paddock, Barbette Hair, MD  ondansetron (ZOFRAN-ODT) 4 MG disintegrating tablet Take 1 tablet (4 mg total) by mouth every 8 (eight) hours as needed for nausea or vomiting. 03/10/22  Yes Gregorey Nabor, Barbette Hair, MD  amLODipine (NORVASC) 5 MG tablet Take 5 mg by mouth daily. 11/27/19   [provider]  Ascorbic Acid (VITAMIN C) 1000 MG tablet Take 1,000 mg by mouth daily.    [provider]  B Complex Vitamins (B COMPLEX PO) Take by mouth.    [provider]  colchicine 0.6 MG tablet Take 0.6 mg by mouth daily as needed.    [provider]  fluticasone (FLONASE) 50 MCG/ACT nasal spray Place into both nostrils daily.    [provider]  gabapentin (NEURONTIN) 300 MG capsule Take 300 mg by mouth at bedtime. 11/24/19   [provider]  HYDROcodone-acetaminophen (NORCO) 5-325 MG tablet 1-2 tabs po q6 hours prn pain 11/07/17   Leanora Cover, MD  irbesartan (AVAPRO) 150 MG tablet Take 150 mg by mouth daily. 11/25/19   [provider]  levothyroxine (SYNTHROID, LEVOTHROID) 75 MCG tablet  Take 75 mcg by mouth daily before breakfast.    [provider]  LUTEIN PO Take by mouth.    [provider]  meloxicam (MOBIC) 7.5 MG tablet Take 7.5 mg by mouth daily. 11/12/19   [provider]  Multiple Vitamins-Minerals (HAIR SKIN AND NAILS FORMULA PO) Take by mouth.    [provider]  simvastatin (ZOCOR) 40 MG tablet Take 40 mg by mouth daily. Patient not taking: Reported on 12/01/2019    [provider]  Specialty Vitamins Products (HEART HEALTH PACK PO) Take by mouth.    [provider]  terbinafine (LAMISIL) 250 MG tablet Take 250 mg by mouth daily.    [provider]  tiZANidine (ZANAFLEX) 2 MG tablet Take 2-4 mg by mouth 3 (three) times daily as needed. 11/13/19   [provider]      Allergies    Asa [aspirin], Tramadol, Cefdinir, Ciprofloxacin, and Sulfa antibiotics    Review of Systems   Review of Systems  Constitutional:  Negative for fever.  Respiratory:  Negative for shortness of breath.   Cardiovascular:  Negative for chest pain.  Gastrointestinal:  Positive for abdominal pain, nausea and vomiting.  All other systems reviewed and are negative.   Physical Exam Updated Vital Signs BP 135/77   Pulse (!) 53   Temp 98.6 F (37 C) (Oral)   Resp 17  Ht 1.6 m ('5\' 3"'$ )   Wt 70.3 kg   SpO2 97%   BMI 27.46 kg/m  Physical Exam Vitals and nursing note reviewed.  Constitutional:      Appearance: She is well-developed.  HENT:     Head: Normocephalic and atraumatic.  Eyes:     Pupils: Pupils are equal, round, and reactive to light.  Cardiovascular:     Rate and Rhythm: Normal rate and regular rhythm.     Heart sounds: Normal heart sounds.  Pulmonary:     Effort: Pulmonary effort is normal. No respiratory distress.     Breath sounds: No wheezing.  Abdominal:     General: Bowel sounds are normal.     Palpations: Abdomen is soft.     Tenderness: There is abdominal tenderness.     Comments:  Epigastric tenderness to palpation, no rebound or guarding  Musculoskeletal:     Cervical back: Neck supple.  Skin:    General: Skin is warm and dry.  Neurological:     General: No focal deficit present.     Mental Status: She is alert and oriented to person, place, and time.     ED Results / Procedures / Treatments   Labs (all labs ordered are listed, but only abnormal results are displayed) Labs Reviewed  GROUP A STREP BY PCR  BASIC METABOLIC PANEL  CBC  LIPASE, BLOOD  HEPATIC FUNCTION PANEL  TROPONIN I (HIGH SENSITIVITY)  TROPONIN I (HIGH SENSITIVITY)    EKG EKG Interpretation  Date/Time:  Friday March 09 2022 18:49:01 EST Ventricular Rate:  58 PR Interval:  144 QRS Duration: 78 QT Interval:  440 QTC Calculation: 431 R Axis:   64 Text Interpretation: Sinus bradycardia with marked sinus arrhythmia Otherwise normal ECG When compared with ECG of 07-Nov-2017 10:18, No significant change was found Confirmed by Thayer Jew [phone]) on 03/10/2022 1:12:20 AM  Radiology CT ABDOMEN PELVIS W CONTRAST  Result Date: 03/10/2022 CLINICAL DATA:  Epigastric pain radiating to the back. EXAM: CT ABDOMEN AND PELVIS WITH CONTRAST TECHNIQUE: Multidetector CT imaging of the abdomen and pelvis was performed using the standard protocol following bolus administration of intravenous contrast. RADIATION DOSE REDUCTION: This exam was performed according to the departmental dose-optimization program which includes automated exposure control, adjustment of the mA and/or kV according to patient size and/or use of iterative reconstruction technique. CONTRAST:  167m OMNIPAQUE IOHEXOL 300 MG/ML  SOLN COMPARISON:  None Available. FINDINGS: Lower chest: Mild atelectasis is seen within the posterior aspect of the right lung base. A trace amount of pleural fluid is also seen on the right Hepatobiliary: No focal liver abnormality is seen. No gallstones, gallbladder wall thickening, or biliary dilatation.  Pancreas: Unremarkable. No pancreatic ductal dilatation or surrounding inflammatory changes. Spleen: Normal in size without focal abnormality. Adrenals/Urinary Tract: Adrenal glands are unremarkable. Kidneys are normal in size, without renal calculi or hydronephrosis. A 3.9 cm x 3.5 cm simple cyst is seen within the posterolateral aspect of the mid left kidney. Subcentimeter simple cysts are seen within the mid and lower right kidney. Bladder is unremarkable. Stomach/Bowel: Stomach is within normal limits. Appendix appears normal. No evidence of bowel wall thickening, distention, or inflammatory changes. Noninflamed diverticula are seen throughout the large bowel. Vascular/Lymphatic: No significant vascular findings are present. No enlarged abdominal or pelvic lymph nodes. Reproductive: The uterus is not clearly identified. A 3.2 cm x 2.4 cm cystic appearing area is seen within the right adnexa. A 1.8 cm x 1.7 cm partially collapsed  right adnexal cyst is also noted. Left adnexa is unremarkable. Other: No abdominal wall hernia or abnormality. No abdominopelvic ascites. Musculoskeletal: Marked severity multilevel degenerative changes seen throughout the lumbar spine. IMPRESSION: 1. Mild right basilar atelectasis with a very small right pleural effusion. 2. Colonic diverticulosis. 3. Bilateral simple renal cysts. No follow-up imaging is recommended. This recommendation follows ACR consensus guidelines: Management of the Incidental Renal Mass on CT: A White Paper of the ACR Incidental Findings Committee. J Am Coll Radiol [phone]. 4. Right adnexal cysts, likely ovarian in origin. Correlation with nonemergent pelvic ultrasound is recommended. 5. Marked severity multilevel degenerative changes throughout the lumbar spine. Electronically Signed   By: Virgina Norfolk [name]   On: 03/10/2022 02:07   DG Chest 2 View  Result Date: 03/09/2022 CLINICAL DATA:  Sharp chest pain. EXAM: CHEST - 2 VIEW COMPARISON:  August 21, 2005 FINDINGS: The heart size and mediastinal contours are within normal limits. Both lungs are clear. There is moderate severity dextroscoliosis of the lower thoracic spine. IMPRESSION: No active cardiopulmonary disease. Electronically Signed   By: Virgina Norfolk [name]   On: 03/09/2022 19:56    Procedures Procedures    Medications Ordered in ED Medications  alum & mag hydroxide-simeth (MAALOX/MYLANTA) 200-200-20 MG/5ML suspension 30 mL (30 mLs Oral Given 03/10/22 0121)  ondansetron (ZOFRAN) injection 4 mg (4 mg Intravenous Given 03/10/22 0121)  morphine (PF) 4 MG/ML injection 4 mg (4 mg Intravenous Given 03/10/22 0119)  iohexol (OMNIPAQUE) 300 MG/ML solution 100 mL (100 mLs Intravenous Contrast Given 03/10/22 0148)    ED Course/ Medical Decision Making/ A&P                           Medical Decision Making Amount and/or Complexity of Data Reviewed Labs: ordered. Radiology: ordered.  Risk OTC drugs. Prescription drug management.   This patient presents to the ED for concern of atypical chest, epigastric pain, this involves an extensive number of treatment options, and is a complaint that carries with it a high risk of complications and morbidity.  I considered the following differential and admission for this acute, potentially life threatening condition.  The differential diagnosis includes atypical ACS, gastritis, gastroenteritis, reflux, peptic ulcer, pancreatitis, cholecystitis  MDM:    This is a 76 year old female who presents with epigastric and chest pain.  She is nontoxic and vital signs are reassuring.  Her symptoms are largely related to food intake.  They would be highly atypical for ACS.  EKG without acute ischemic changes.  Troponin x2 negative.  Labs otherwise unremarkable.  Negative LFTs and lipase.  Doubt pancreatitis.  Given her age and symptomatology, CT abdomen pelvis was obtained.  CT does not show any acute abdominal pathology.  Patient largely improved with  GI cocktail.  Will treat for gastritis versus peptic ulcer and recommend follow-up with Dr. Michail Sermon.  Patient stated understanding.  (Labs, imaging, consults)  Labs: I Ordered, and personally interpreted labs.  The pertinent results include: CBC, CMP, lipase, troponin x2  Imaging Studies ordered: I ordered imaging studies including chest x-ray, CT abdomen pelvis I independently visualized and interpreted imaging. I agree with the radiologist interpretation  Additional history obtained from daughter at bedside.  External records from outside source obtained and reviewed including prior evaluations  Cardiac Monitoring: The patient was maintained on a cardiac monitor.  I personally viewed and interpreted the cardiac monitored which showed an underlying rhythm of: Normal sinus rhythm  Reevaluation: After the  interventions noted above, I reevaluated the patient and found that they have :improved  Social Determinants of Health:  lives independently  Disposition: Discharge  Co morbidities that complicate the patient evaluation  Past Medical History:  Diagnosis Date   Arthritis    Curvature of spine    Gout    Hyperlipidemia    Hypertension    Hypothyroidism    Spinal stenosis    and degeneration of spine, pending injections 12/10/19     Medicines Meds ordered this encounter  Medications   alum & mag hydroxide-simeth (MAALOX/MYLANTA) 200-200-20 MG/5ML suspension 30 mL   ondansetron (ZOFRAN) injection 4 mg   morphine (PF) 4 MG/ML injection 4 mg   iohexol (OMNIPAQUE) 300 MG/ML solution 100 mL   ondansetron (ZOFRAN-ODT) 4 MG disintegrating tablet    Sig: Take 1 tablet (4 mg total) by mouth every 8 (eight) hours as needed for nausea or vomiting.    Dispense:  20 tablet    Refill:  0   omeprazole (PRILOSEC) 20 MG capsule    Sig: Take 1 capsule (20 mg total) by mouth daily.    Dispense:  30 capsule    Refill:  0    I have reviewed the patients home medicines and have made  adjustments as needed  Problem List / ED Course: Problem List Items Addressed This Visit   None Visit Diagnoses     Epigastric pain    -  Primary   Atypical chest pain                       Final Clinical Impression(s) / ED Diagnoses Final diagnoses:  Epigastric pain  Atypical chest pain    Rx / DC Orders ED Discharge Orders          Ordered    ondansetron (ZOFRAN-ODT) 4 MG disintegrating tablet  Every 8 hours PRN        03/10/22 0232    omeprazole (PRILOSEC) 20 MG capsule  Daily        03/10/22 0232              Merryl Hacker, MD 03/10/22 [phone]

## 2022-03-13 DIAGNOSIS — R1310 Dysphagia, unspecified: Secondary | ICD-10-CM | POA: Diagnosis not present

## 2022-03-13 DIAGNOSIS — K209 Esophagitis, unspecified without bleeding: Secondary | ICD-10-CM | POA: Diagnosis not present

## 2022-03-14 ENCOUNTER — Telehealth: Payer: Self-pay

## 2022-03-14 NOTE — Telephone Encounter (Signed)
     Patient  visit on 11/11  at [facility]   Have you been able to follow up with your primary care physician? Yes   The patient was or was not able to obtain any needed medicine or equipment. Yes   Are there diet recommendations that you are having difficulty following? Na   Patient expresses understanding of discharge instructions and education provided has no other needs at this time.  Yes      [place], [facility], Care Management  [phone] [address], [place], [place] [address] Phone: [phone] Email: [email or web address]

## 2022-03-29 DIAGNOSIS — K2930 Chronic superficial gastritis without bleeding: Secondary | ICD-10-CM | POA: Diagnosis not present

## 2022-03-29 DIAGNOSIS — K2970 Gastritis, unspecified, without bleeding: Secondary | ICD-10-CM | POA: Diagnosis not present

## 2022-03-29 DIAGNOSIS — K2289 Other specified disease of esophagus: Secondary | ICD-10-CM | POA: Diagnosis not present

## 2022-03-29 DIAGNOSIS — K219 Gastro-esophageal reflux disease without esophagitis: Secondary | ICD-10-CM | POA: Diagnosis not present

## 2022-03-29 DIAGNOSIS — R1310 Dysphagia, unspecified: Secondary | ICD-10-CM | POA: Diagnosis not present

## 2022-03-29 DIAGNOSIS — K449 Diaphragmatic hernia without obstruction or gangrene: Secondary | ICD-10-CM | POA: Diagnosis not present

## 2022-04-02 DIAGNOSIS — K219 Gastro-esophageal reflux disease without esophagitis: Secondary | ICD-10-CM | POA: Diagnosis not present

## 2022-04-02 DIAGNOSIS — K2289 Other specified disease of esophagus: Secondary | ICD-10-CM | POA: Diagnosis not present

## 2022-04-02 DIAGNOSIS — K2930 Chronic superficial gastritis without bleeding: Secondary | ICD-10-CM | POA: Diagnosis not present

## 2022-05-01 DIAGNOSIS — K2970 Gastritis, unspecified, without bleeding: Secondary | ICD-10-CM | POA: Diagnosis not present

## 2022-05-01 DIAGNOSIS — Z86010 Personal history of colonic polyps: Secondary | ICD-10-CM | POA: Diagnosis not present

## 2022-05-01 DIAGNOSIS — K449 Diaphragmatic hernia without obstruction or gangrene: Secondary | ICD-10-CM | POA: Diagnosis not present

## 2022-05-01 DIAGNOSIS — K2100 Gastro-esophageal reflux disease with esophagitis, without bleeding: Secondary | ICD-10-CM | POA: Diagnosis not present

## 2022-06-08 DIAGNOSIS — M7122 Synovial cyst of popliteal space [Baker], left knee: Secondary | ICD-10-CM | POA: Diagnosis not present

## 2022-07-05 DIAGNOSIS — D123 Benign neoplasm of transverse colon: Secondary | ICD-10-CM | POA: Diagnosis not present

## 2022-07-05 DIAGNOSIS — K648 Other hemorrhoids: Secondary | ICD-10-CM | POA: Diagnosis not present

## 2022-07-05 DIAGNOSIS — Z09 Encounter for follow-up examination after completed treatment for conditions other than malignant neoplasm: Secondary | ICD-10-CM | POA: Diagnosis not present

## 2022-07-05 DIAGNOSIS — Z86010 Personal history of colonic polyps: Secondary | ICD-10-CM | POA: Diagnosis not present

## 2022-07-05 DIAGNOSIS — K5730 Diverticulosis of large intestine without perforation or abscess without bleeding: Secondary | ICD-10-CM | POA: Diagnosis not present

## 2022-07-09 DIAGNOSIS — D123 Benign neoplasm of transverse colon: Secondary | ICD-10-CM | POA: Diagnosis not present

## 2022-10-08 DIAGNOSIS — I10 Essential (primary) hypertension: Secondary | ICD-10-CM | POA: Diagnosis not present

## 2022-10-08 DIAGNOSIS — E7800 Pure hypercholesterolemia, unspecified: Secondary | ICD-10-CM | POA: Diagnosis not present

## 2022-10-08 DIAGNOSIS — M109 Gout, unspecified: Secondary | ICD-10-CM | POA: Diagnosis not present

## 2022-10-08 DIAGNOSIS — E039 Hypothyroidism, unspecified: Secondary | ICD-10-CM | POA: Diagnosis not present

## 2022-10-12 DIAGNOSIS — Z0000 Encounter for general adult medical examination without abnormal findings: Secondary | ICD-10-CM | POA: Diagnosis not present

## 2022-10-12 DIAGNOSIS — E7800 Pure hypercholesterolemia, unspecified: Secondary | ICD-10-CM | POA: Diagnosis not present

## 2022-10-12 DIAGNOSIS — M109 Gout, unspecified: Secondary | ICD-10-CM | POA: Diagnosis not present

## 2022-10-12 DIAGNOSIS — E039 Hypothyroidism, unspecified: Secondary | ICD-10-CM | POA: Diagnosis not present

## 2022-10-12 DIAGNOSIS — I10 Essential (primary) hypertension: Secondary | ICD-10-CM | POA: Diagnosis not present

## 2022-10-12 DIAGNOSIS — R252 Cramp and spasm: Secondary | ICD-10-CM | POA: Diagnosis not present

## 2022-10-12 DIAGNOSIS — I739 Peripheral vascular disease, unspecified: Secondary | ICD-10-CM | POA: Diagnosis not present

## 2022-10-12 DIAGNOSIS — J309 Allergic rhinitis, unspecified: Secondary | ICD-10-CM | POA: Diagnosis not present

## 2022-11-12 ENCOUNTER — Other Ambulatory Visit: Payer: Self-pay | Admitting: Family Medicine

## 2022-11-12 DIAGNOSIS — Z1231 Encounter for screening mammogram for malignant neoplasm of breast: Secondary | ICD-10-CM

## 2022-12-03 DIAGNOSIS — M79601 Pain in right arm: Secondary | ICD-10-CM | POA: Diagnosis not present

## 2022-12-03 DIAGNOSIS — M25531 Pain in right wrist: Secondary | ICD-10-CM | POA: Diagnosis not present

## 2022-12-10 ENCOUNTER — Ambulatory Visit
Admission: RE | Admit: 2022-12-10 | Discharge: 2022-12-10 | Disposition: A | Discharge status: Home / Self Care | Payer: 59 | Source: Ambulatory Visit | Attending: Family Medicine | Admitting: Family Medicine

## 2022-12-10 ENCOUNTER — Other Ambulatory Visit: Payer: Self-pay | Admitting: Family Medicine

## 2022-12-10 DIAGNOSIS — M79601 Pain in right arm: Secondary | ICD-10-CM

## 2022-12-10 DIAGNOSIS — M25511 Pain in right shoulder: Secondary | ICD-10-CM | POA: Diagnosis not present

## 2022-12-11 ENCOUNTER — Ambulatory Visit: Admission: RE | Admit: 2022-12-11 | Payer: 59 | Source: Ambulatory Visit

## 2022-12-11 ENCOUNTER — Other Ambulatory Visit: Payer: Self-pay | Admitting: Family Medicine

## 2022-12-11 DIAGNOSIS — M25531 Pain in right wrist: Secondary | ICD-10-CM

## 2022-12-11 DIAGNOSIS — M79601 Pain in right arm: Secondary | ICD-10-CM

## 2022-12-11 DIAGNOSIS — M25521 Pain in right elbow: Secondary | ICD-10-CM | POA: Diagnosis not present

## 2022-12-11 DIAGNOSIS — M19041 Primary osteoarthritis, right hand: Secondary | ICD-10-CM | POA: Diagnosis not present

## 2022-12-12 DIAGNOSIS — H5213 Myopia, bilateral: Secondary | ICD-10-CM | POA: Diagnosis not present

## 2022-12-17 ENCOUNTER — Ambulatory Visit: Payer: Medicare Other

## 2022-12-18 ENCOUNTER — Ambulatory Visit: Admission: RE | Admit: 2022-12-18 | Payer: 59 | Source: Ambulatory Visit

## 2022-12-18 DIAGNOSIS — Z1231 Encounter for screening mammogram for malignant neoplasm of breast: Secondary | ICD-10-CM | POA: Diagnosis not present

## 2023-02-06 DIAGNOSIS — H524 Presbyopia: Secondary | ICD-10-CM | POA: Diagnosis not present

## 2023-06-28 DIAGNOSIS — E039 Hypothyroidism, unspecified: Secondary | ICD-10-CM | POA: Diagnosis not present

## 2023-07-05 DIAGNOSIS — M79646 Pain in unspecified finger(s): Secondary | ICD-10-CM | POA: Diagnosis not present

## 2023-07-31 DIAGNOSIS — M180 Bilateral primary osteoarthritis of first carpometacarpal joints: Secondary | ICD-10-CM | POA: Diagnosis not present

## 2023-11-11 ENCOUNTER — Other Ambulatory Visit: Payer: Self-pay | Admitting: Family Medicine

## 2023-11-11 DIAGNOSIS — Z1231 Encounter for screening mammogram for malignant neoplasm of breast: Secondary | ICD-10-CM

## 2023-11-15 DIAGNOSIS — E039 Hypothyroidism, unspecified: Secondary | ICD-10-CM | POA: Diagnosis not present

## 2023-11-15 DIAGNOSIS — E7800 Pure hypercholesterolemia, unspecified: Secondary | ICD-10-CM | POA: Diagnosis not present

## 2023-11-15 DIAGNOSIS — M109 Gout, unspecified: Secondary | ICD-10-CM | POA: Diagnosis not present

## 2023-11-15 DIAGNOSIS — I10 Essential (primary) hypertension: Secondary | ICD-10-CM | POA: Diagnosis not present

## 2023-11-22 DIAGNOSIS — I10 Essential (primary) hypertension: Secondary | ICD-10-CM | POA: Diagnosis not present

## 2023-11-22 DIAGNOSIS — E039 Hypothyroidism, unspecified: Secondary | ICD-10-CM | POA: Diagnosis not present

## 2023-11-22 DIAGNOSIS — J301 Allergic rhinitis due to pollen: Secondary | ICD-10-CM | POA: Diagnosis not present

## 2023-11-22 DIAGNOSIS — E7800 Pure hypercholesterolemia, unspecified: Secondary | ICD-10-CM | POA: Diagnosis not present

## 2023-11-22 DIAGNOSIS — R519 Headache, unspecified: Secondary | ICD-10-CM | POA: Diagnosis not present

## 2023-11-22 DIAGNOSIS — Z0000 Encounter for general adult medical examination without abnormal findings: Secondary | ICD-10-CM | POA: Diagnosis not present

## 2023-11-22 DIAGNOSIS — K2100 Gastro-esophageal reflux disease with esophagitis, without bleeding: Secondary | ICD-10-CM | POA: Diagnosis not present

## 2023-11-22 DIAGNOSIS — M109 Gout, unspecified: Secondary | ICD-10-CM | POA: Diagnosis not present

## 2023-11-22 DIAGNOSIS — M4800 Spinal stenosis, site unspecified: Secondary | ICD-10-CM | POA: Diagnosis not present

## 2023-12-24 ENCOUNTER — Ambulatory Visit
Admission: RE | Admit: 2023-12-24 | Discharge: 2023-12-24 | Disposition: A | Discharge status: Home / Self Care | Source: Ambulatory Visit | Attending: Family Medicine | Admitting: Family Medicine

## 2023-12-24 DIAGNOSIS — Z1231 Encounter for screening mammogram for malignant neoplasm of breast: Secondary | ICD-10-CM | POA: Diagnosis not present

## 2024-01-20 DIAGNOSIS — K219 Gastro-esophageal reflux disease without esophagitis: Secondary | ICD-10-CM | POA: Diagnosis not present

## 2024-01-30 DIAGNOSIS — I10 Essential (primary) hypertension: Secondary | ICD-10-CM | POA: Diagnosis not present

## 2024-01-30 DIAGNOSIS — R21 Rash and other nonspecific skin eruption: Secondary | ICD-10-CM | POA: Diagnosis not present

## 2024-01-30 DIAGNOSIS — J029 Acute pharyngitis, unspecified: Secondary | ICD-10-CM | POA: Diagnosis not present

## 2024-02-11 DIAGNOSIS — M5416 Radiculopathy, lumbar region: Secondary | ICD-10-CM | POA: Insufficient documentation

## 2024-02-11 DIAGNOSIS — M48061 Spinal stenosis, lumbar region without neurogenic claudication: Secondary | ICD-10-CM | POA: Insufficient documentation

## 2024-03-04 ENCOUNTER — Encounter: Payer: Self-pay | Admitting: Neurology

## 2024-03-04 ENCOUNTER — Ambulatory Visit: Admitting: Neurology

## 2024-03-04 VITALS — BP 163/90 | HR 60 | Ht 64.0 in | Wt 164.0 lb

## 2024-03-04 DIAGNOSIS — M5412 Radiculopathy, cervical region: Secondary | ICD-10-CM | POA: Diagnosis not present

## 2024-03-04 DIAGNOSIS — M542 Cervicalgia: Secondary | ICD-10-CM | POA: Diagnosis not present

## 2024-03-04 NOTE — Progress Notes (Signed)
 [facility]  PATIENT: Natasha [name] DOB: 1946/01/20  REQUESTING CLINICIAN: Okey Carlin Redbird, MD HISTORY FROM: Patient REASON FOR VISIT: Left-sided headache and left neck pain   HISTORICAL  CHIEF COMPLAINT:  Chief Complaint  Patient presents with   RM12/HEADACHES    Pt is here Alone. Pt states she has pain in her head that is mainly on her left side and the back of her neck as well.     HISTORY OF PRESENT ILLNESS:  Discussed the use of AI scribe software for clinical note transcription with the patient, who gave verbal consent to proceed.  Natasha [name] is a 78 year old female with arthritis and prior mini strokes, hypothyroidism, hypertension, hyperlipidemia, neuropathic pain who presents with intermittent head and neck pain.  She experiences intermittent sharp pains in her head, primarily at the top, sometimes extending to the left occipital region and the base of her neck. These episodes last for a few seconds and occur once or twice a day. The frequency was higher earlier in the summer but has since decreased. The pain is described as a sharp ache that comes and goes quickly.  She has a history of arthritis in her neck and back, diagnosed in 2021, leading to occasional neck pain and pain radiating down her arm. She has not engaged in physical therapy recently but continues to perform exercises learned from previous sessions weekly, including neck movements learned from yoga.  She has a history of three mini strokes and was previously on gabapentin for back pain, which she took once daily at night due to drowsiness. She recently switched to pregabalin.  No persistent neck pain, but occasional pain radiating down the arm.     OTHER MEDICAL CONDITIONS: Cervical spine stenosis, hypertension, hyperlipidemia, hypothyroidism, neuropathy pain, chronic neck and back pain   REVIEW OF SYSTEMS: Full 14 system review of systems performed and negative  with exception of: As noted in the HPI HPI  ALLERGIES: Allergies  Allergen Reactions   Asa [Aspirin] Nausea And Vomiting   Tramadol Other (See Comments)    delusional   Tramadol-Acetaminophen  Other (See Comments)    Other Reaction(s): Delusions (intolerance)    Other Reaction(s): unknown  acetaminophen  / tramadol   Cefdinir Diarrhea   Ciprofloxacin Rash   Sulfa Antibiotics Rash    HOME MEDICATIONS: Outpatient Medications Prior to Visit  Medication Sig Dispense Refill   amLODipine (NORVASC) 5 MG tablet Take 5 mg by mouth daily.     Ascorbic Acid (VITAMIN C) 1000 MG tablet Take 1,000 mg by mouth daily.     B Complex Vitamins (B COMPLEX PO) Take by mouth.     colchicine 0.6 MG tablet Take 0.6 mg by mouth daily as needed.     fluticasone (FLONASE) 50 MCG/ACT nasal spray Place into both nostrils daily.     HYDROcodone -acetaminophen  (NORCO) 5-325 MG tablet 1-2 tabs po q6 hours prn pain 20 tablet 0   levothyroxine (SYNTHROID, LEVOTHROID) 75 MCG tablet Take 75 mcg by mouth daily before breakfast.     LUTEIN PO Take by mouth.     Multiple Vitamins-Minerals (HAIR SKIN AND NAILS FORMULA PO) Take by mouth.     omeprazole  (PRILOSEC) 20 MG capsule Take 1 capsule (20 mg total) by mouth daily. 30 capsule 0   simvastatin (ZOCOR) 40 MG tablet Take 40 mg by mouth daily.     Specialty Vitamins Products (HEART HEALTH PACK PO) Take by mouth.     gabapentin (NEURONTIN) 300 MG  capsule Take 300 mg by mouth at bedtime. (Patient not taking: Reported on 03/04/2024)     irbesartan (AVAPRO) 150 MG tablet Take 150 mg by mouth daily. (Patient not taking: Reported on 03/04/2024)     meloxicam (MOBIC) 7.5 MG tablet Take 7.5 mg by mouth daily. (Patient not taking: Reported on 03/04/2024)     ondansetron  (ZOFRAN -ODT) 4 MG disintegrating tablet Take 1 tablet (4 mg total) by mouth every 8 (eight) hours as needed for nausea or vomiting. (Patient not taking: Reported on 03/04/2024) 20 tablet 0   pregabalin (LYRICA) 75 MG  capsule Take 75 mg by mouth 2 (two) times daily. (Patient not taking: Reported on 03/04/2024)     terbinafine (LAMISIL) 250 MG tablet Take 250 mg by mouth daily. (Patient not taking: Reported on 03/04/2024)     tiZANidine (ZANAFLEX) 2 MG tablet Take 2-4 mg by mouth 3 (three) times daily as needed. (Patient not taking: Reported on 03/04/2024)     No facility-administered medications prior to visit.    PAST MEDICAL HISTORY: Past Medical History:  Diagnosis Date   Arthritis    Curvature of spine    Gout    Hyperlipidemia    Hypertension    Hypothyroidism    Spinal stenosis    and degeneration of spine, pending injections 12/10/19    PAST SURGICAL HISTORY: Past Surgical History:  Procedure Laterality Date   HERNIA REPAIR     THYROID  SURGERY     TRIGGER FINGER RELEASE Left 11/07/2017   Procedure: RELEASE TRIGGER FINGER/A-1 PULLEY EFT MIDDLE FINGER;  Surgeon: Murrell Drivers, MD;  Location: [facility];  Service: Orthopedics;  Laterality: Left;    FAMILY HISTORY: Family History  Problem Relation Age of Onset   Breast cancer Mother 29   Leukemia Father    Diabetes Father    Diabetes Other     SOCIAL HISTORY: Social History   Socioeconomic History   Marital status: Married    Spouse name: separated, not legally    Number of children: Not on file   Years of education: Not on file   Highest education level: Not on file  Occupational History   Not on file  Tobacco Use   Smoking status: Former   Smokeless tobacco: Never  Vaping Use   Vaping status: Never Used  Substance and Sexual Activity   Alcohol use: Never   Drug use: Never   Sexual activity: Not on file  Other Topics Concern   Not on file  Social History Narrative   Lives alone   Right handed   Caffeine: none    Social Drivers of Health   Financial Resource Strain: Not on file  Food Insecurity: Low Risk  (07/31/2023)   Received from [facility]   Hunger Vital Sign    Within the past 12 months, you  worried that your food would run out before you got money to buy more: Never true    Within the past 12 months, the food you bought just didn't last and you didn't have money to get more. : Never true  Transportation Needs: No Transportation Needs (07/31/2023)   Received from [facility]    In the past 12 months, has lack of reliable transportation kept you from medical appointments, meetings, work or from getting things needed for daily living? : No  Physical Activity: Not on file  Stress: Not on file  Social Connections: Not on file  Intimate Partner Violence: Not on file  PHYSICAL EXAM  GENERAL EXAM/CONSTITUTIONAL: Vitals:  Vitals:   03/04/24 1102  BP: (!) 163/90  Pulse: 60  SpO2: 99%  Weight: 164 lb (74.4 kg)  Height: 5' 4 (1.626 m)   Body mass index is 28.15 kg/m. Wt Readings from Last 3 Encounters:  03/04/24 164 lb (74.4 kg)  03/09/22 155 lb (70.3 kg)  12/01/19 170 lb (77.1 kg)   Patient is in no distress; well developed, nourished and groomed; neck is supple  MUSCULOSKELETAL: Gait, strength, tone, movements noted in Neurologic exam below  NEUROLOGIC: MENTAL STATUS:      No data to display         awake, alert, oriented to person, place and time recent and remote memory intact normal attention and concentration language fluent, comprehension intact, naming intact fund of knowledge appropriate  CRANIAL NERVE:  2nd, 3rd, 4th, 6th - visual fields full to confrontation, extraocular muscles intact, no nystagmus 5th - facial sensation symmetric 7th - facial strength symmetric 8th - hearing intact 9th - palate elevates symmetrically, uvula midline 11th - shoulder shrug symmetric 12th - tongue protrusion midline  MOTOR:  normal bulk and tone, full strength in the BUE, BLE  SENSORY:  normal and symmetric to light touch, pinprick  COORDINATION:  finger-nose-finger, fine finger movements normal   REFLEX   Present but decrease on the  right when compared to the left.   GAIT/STATION:  normal   DIAGNOSTIC DATA (LABS, IMAGING, TESTING) - I reviewed patient records, labs, notes, testing and imaging myself where available.  Lab Results  Component Value Date   WBC 8.2 03/09/2022   HGB 13.7 03/09/2022   HCT 40.9 03/09/2022   MCV 84.3 03/09/2022   PLT 208 03/09/2022      Component Value Date/Time   NA 143 03/09/2022 1854   K 4.0 03/09/2022 1854   CL 104 03/09/2022 1854   CO2 27 03/09/2022 1854   GLUCOSE 81 03/09/2022 1854   BUN 8 03/09/2022 1854   CREATININE 0.76 03/09/2022 1854   CALCIUM 9.2 03/09/2022 1854   PROT 7.1 03/09/2022 2200   ALBUMIN 4.8 03/09/2022 2200   AST 20 03/09/2022 2200   ALT 17 03/09/2022 2200   ALKPHOS 79 03/09/2022 2200   BILITOT 0.7 03/09/2022 2200   GFRNONAA >60 03/09/2022 1854   No results found for: CHOL, HDL, LDLCALC, LDLDIRECT, TRIG, CHOLHDL No results found for: YHAJ8R No results found for: VITAMINB12 No results found for: TSH  MRI Brain 10/06/2019 1. The tiny T2 hyperintense structures seen on each thalami on prior MRI of the cervical spine are favored to represent prominent perivascular spaces at the level of the posterior commissure. 2. Mild chronic small vessel ischemic changes of the white matter.  MRI Cervical spine 08/31/2019 1. Multilevel degenerative changes of the cervical spine as described above, with multilevel neural foraminal stenosis, severe on the right at C5-6 and on the left at C6-7. 2. Mild spinal canal stenosis at C5-6. 3. No abnormal cord signal. 4. Tiny T2 hyperintense foci within the thalami, as described above. Correlation with MRI of the brain suggeste    ASSESSMENT AND PLAN  78 y.o. 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 Reflex asymmetry noted, more pronounced on the  left side. No current weakness or numbness. Previous imaging in 2021 showed evidence of cervical canal stenosis and foraminal stenosis. Conservative management preferred due  to potential side effects of medications. - Patient will continue to do daily physical therapy for neck exercises and stretching. - Advised daily home exercises to manage symptoms. - Instructed to monitor for new symptoms such as numbness or weakness, and seek immediate medical attention if these occur.  Head pain likely neuralgia Intermittent sharp head pain, likely neuralgia, possibly related to cervical spondylosis. Pain is brief, lasting seconds, and occurs once or twice daily. Conservative management preferred due to potential side effects of medications. - Advised daily neck exercises to alleviate symptoms. - Instructed to monitor pain frequency and severity, and report if symptoms worsen.   1. Cervicalgia   2. Cervical radiculopathy     Patient Instructions  Continue current medications for now including pregabalin Continue with daily leg exercise Continue follow-up PCP Return if worse   No orders of the defined types were placed in this encounter.   No orders of the defined types were placed in this encounter.   Return if symptoms worsen or fail to improve.    Pastor Falling, MD 03/04/2024, 12:02 PM  [facility] [address] [place], [place] [address] [phone]

## 2024-03-04 NOTE — Patient Instructions (Signed)
 Continue current medications for now including pregabalin Continue with daily leg exercise Continue follow-up PCP Return if worse

## 2024-04-14 ENCOUNTER — Ambulatory Visit: Admitting: Neurology
